# Patient Record
Sex: Male | Born: 1955 | Race: White | Hispanic: No | Marital: Married | Smoking: Current every day smoker
Health system: Southern US, Community
[De-identification: ages and names within clinical notes are randomized; demographics above are authoritative.]

## PROBLEM LIST (undated history)

## (undated) DIAGNOSIS — E785 Hyperlipidemia, unspecified: Secondary | ICD-10-CM

## (undated) DIAGNOSIS — R739 Hyperglycemia, unspecified: Secondary | ICD-10-CM

## (undated) DIAGNOSIS — M1990 Unspecified osteoarthritis, unspecified site: Secondary | ICD-10-CM

## (undated) DIAGNOSIS — C4490 Unspecified malignant neoplasm of skin, unspecified: Secondary | ICD-10-CM

## (undated) DIAGNOSIS — I10 Essential (primary) hypertension: Secondary | ICD-10-CM

## (undated) DIAGNOSIS — I2510 Atherosclerotic heart disease of native coronary artery without angina pectoris: Secondary | ICD-10-CM

## (undated) HISTORY — DX: Unspecified osteoarthritis, unspecified site: M19.90

## (undated) HISTORY — DX: Hyperglycemia, unspecified: R73.9

## (undated) HISTORY — PX: SKIN SURGERY: SHX2413

## (undated) HISTORY — PX: CORONARY ANGIOPLASTY WITH STENT PLACEMENT: SHX49

## (undated) HISTORY — DX: Hyperlipidemia, unspecified: E78.5

## (undated) HISTORY — DX: Unspecified malignant neoplasm of skin, unspecified: C44.90

## (undated) HISTORY — DX: Essential (primary) hypertension: I10

---

## 2004-02-02 ENCOUNTER — Other Ambulatory Visit: Payer: Self-pay

## 2004-02-02 ENCOUNTER — Emergency Department: Payer: Self-pay | Admitting: Emergency Medicine

## 2004-04-08 ENCOUNTER — Ambulatory Visit: Payer: Self-pay | Admitting: Cardiovascular Disease

## 2004-09-08 ENCOUNTER — Emergency Department: Payer: Self-pay | Admitting: Emergency Medicine

## 2004-09-14 ENCOUNTER — Emergency Department: Payer: Self-pay | Admitting: General Practice

## 2004-11-16 ENCOUNTER — Ambulatory Visit: Payer: Self-pay | Admitting: Internal Medicine

## 2005-02-17 ENCOUNTER — Emergency Department: Payer: Self-pay | Admitting: Emergency Medicine

## 2005-03-12 ENCOUNTER — Ambulatory Visit: Payer: Self-pay | Admitting: Urology

## 2006-01-17 ENCOUNTER — Ambulatory Visit: Payer: Self-pay | Admitting: Family Medicine

## 2006-07-15 ENCOUNTER — Emergency Department: Payer: Self-pay | Admitting: Emergency Medicine

## 2006-09-11 ENCOUNTER — Ambulatory Visit: Payer: Self-pay | Admitting: Cardiology

## 2008-03-02 ENCOUNTER — Emergency Department: Payer: Self-pay | Admitting: Emergency Medicine

## 2009-12-31 ENCOUNTER — Ambulatory Visit: Payer: Self-pay | Admitting: Gastroenterology

## 2012-09-10 ENCOUNTER — Ambulatory Visit: Payer: Self-pay | Admitting: Family Medicine

## 2012-10-05 ENCOUNTER — Ambulatory Visit: Payer: Self-pay | Admitting: Family Medicine

## 2012-11-05 ENCOUNTER — Ambulatory Visit: Payer: Self-pay | Admitting: Otolaryngology

## 2013-04-07 ENCOUNTER — Ambulatory Visit: Payer: Self-pay | Admitting: Unknown Physician Specialty

## 2013-05-19 ENCOUNTER — Ambulatory Visit: Payer: Self-pay | Admitting: Family Medicine

## 2013-08-03 DIAGNOSIS — Z720 Tobacco use: Secondary | ICD-10-CM | POA: Insufficient documentation

## 2013-08-03 DIAGNOSIS — E7800 Pure hypercholesterolemia, unspecified: Secondary | ICD-10-CM | POA: Insufficient documentation

## 2013-08-03 DIAGNOSIS — I2510 Atherosclerotic heart disease of native coronary artery without angina pectoris: Secondary | ICD-10-CM | POA: Insufficient documentation

## 2013-08-03 DIAGNOSIS — R739 Hyperglycemia, unspecified: Secondary | ICD-10-CM | POA: Insufficient documentation

## 2013-08-03 DIAGNOSIS — I10 Essential (primary) hypertension: Secondary | ICD-10-CM | POA: Insufficient documentation

## 2013-12-13 ENCOUNTER — Ambulatory Visit: Payer: Self-pay | Admitting: Family Medicine

## 2014-08-08 IMAGING — CT CT CHEST W/ CM
1 series · 15 of 33 positions shown, 19 images · IV contrast (isovue)
Comparison: none

REASON FOR EXAM: SOB  TOBACCO user
COMMENTS:

PROCEDURE:     KCT - KCT CHEST WITH CONTRAST  - September 10, 2012  [DATE]
RESULT:     Comparison: None
TECHNIQUE: Multiple axial images of the chest were obtained with 75 mL
Isovue 370 intravenous contrast.

[Series 2: chest w/ 3.0 i31f 2 · axial · 0.82mm/px · z∈[-392,-116]mm · 15 of 109 slices shown, 19 images]
[im 9/109  mediastinal]
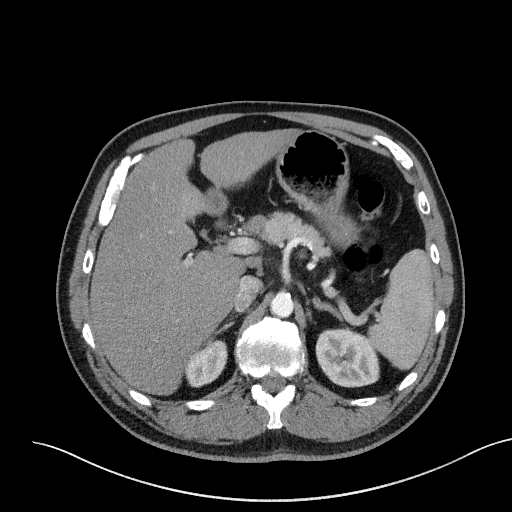
[im 9/109  lung]
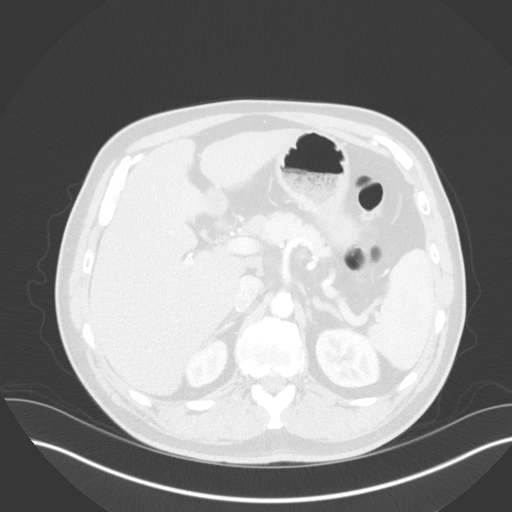
[im 17/109  lung]
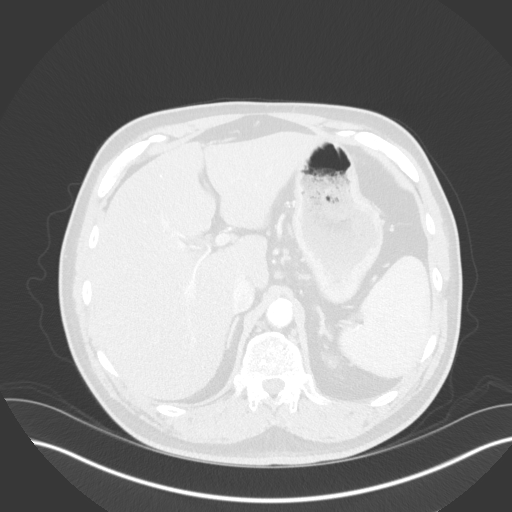
[im 22/109  lung]
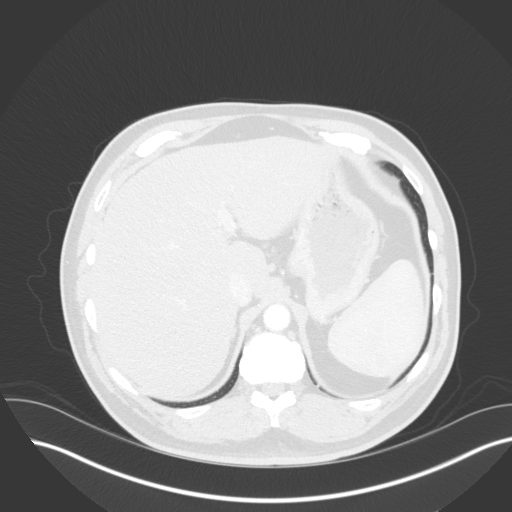
[im 29/109  lung]
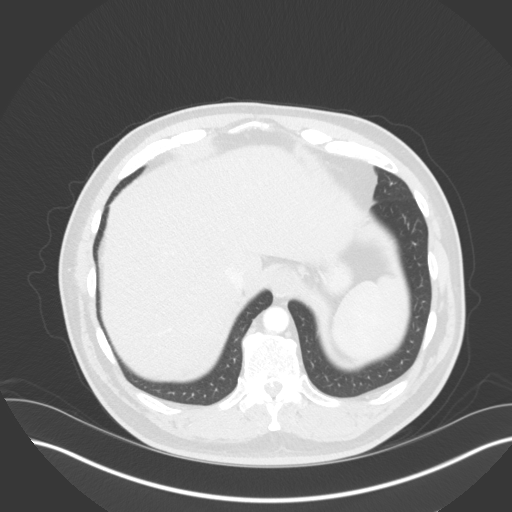
[im 37/109  mediastinal]
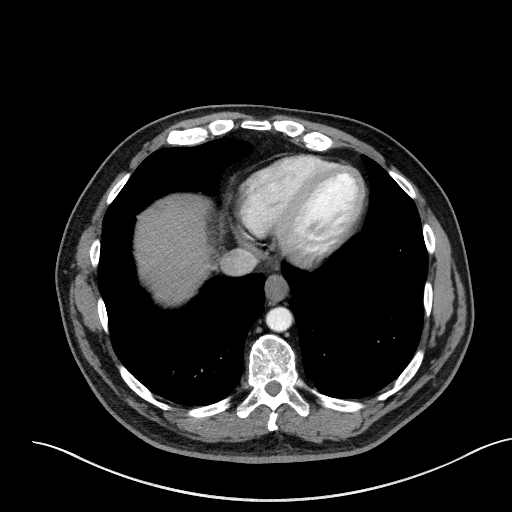
[im 37/109  lung]
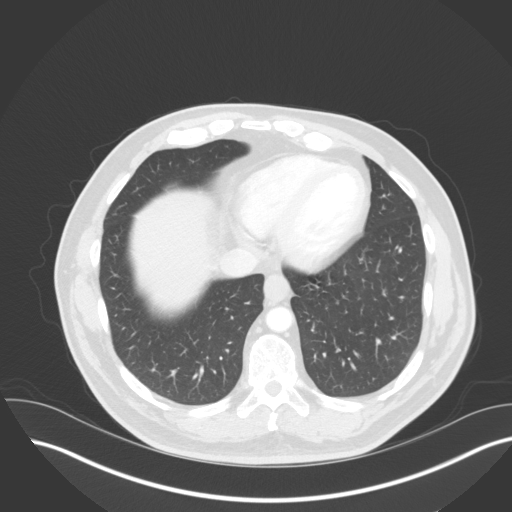
[im 44/109  lung]
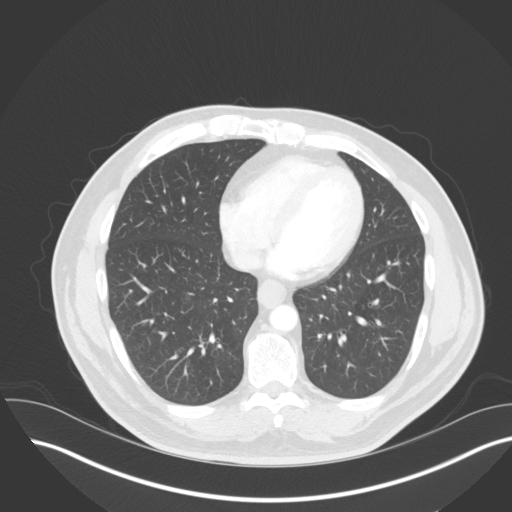
[im 49/109  lung]
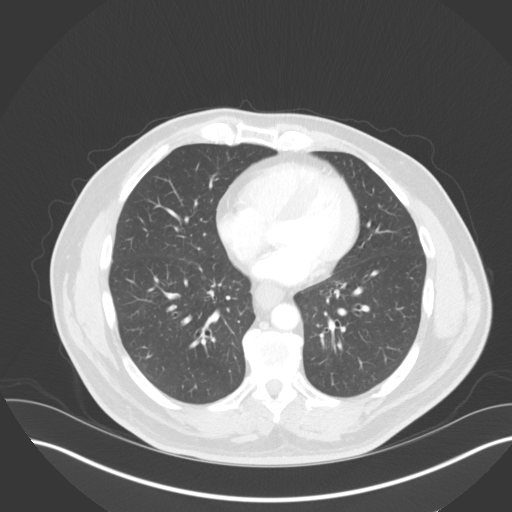
[im 53/109  lung]
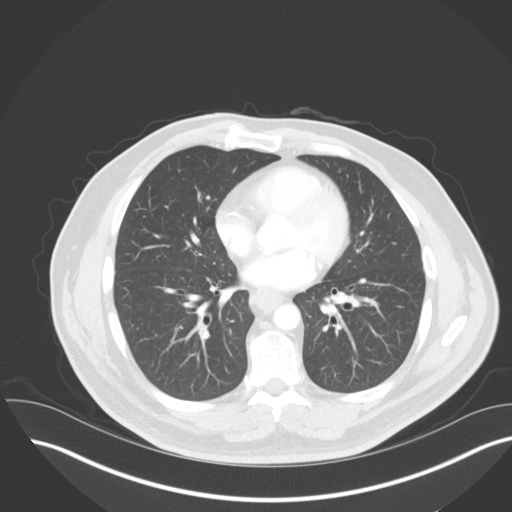
[im 58/109  mediastinal]
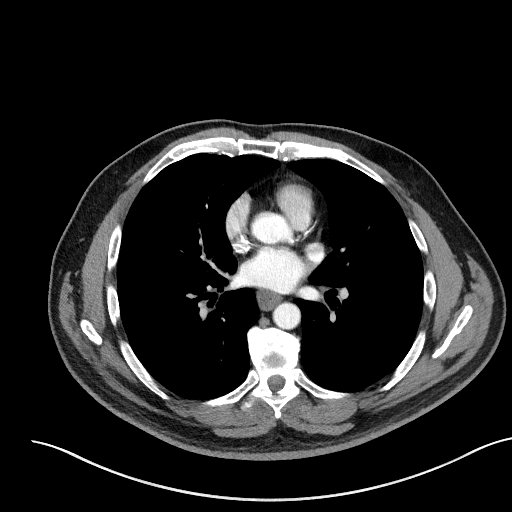
[im 58/109  lung]
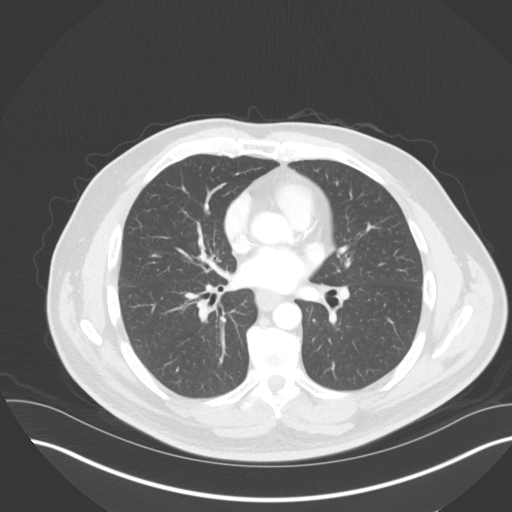
[im 65/109  lung]
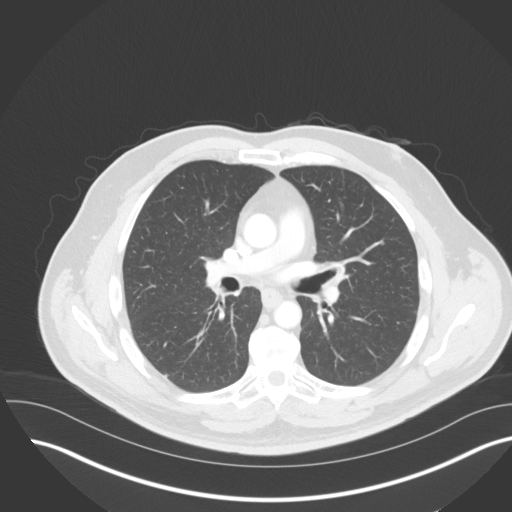
[im 73/109  lung]
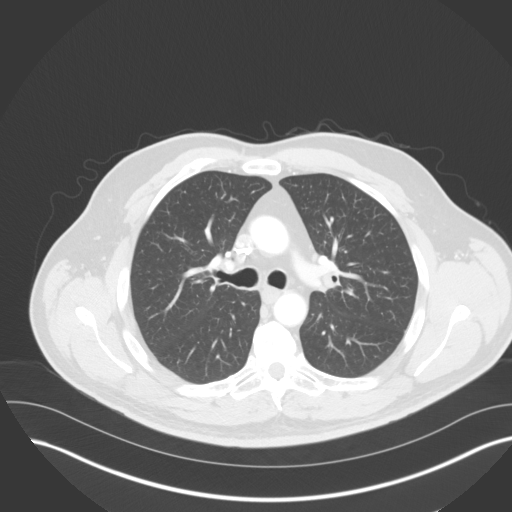
[im 81/109  lung]
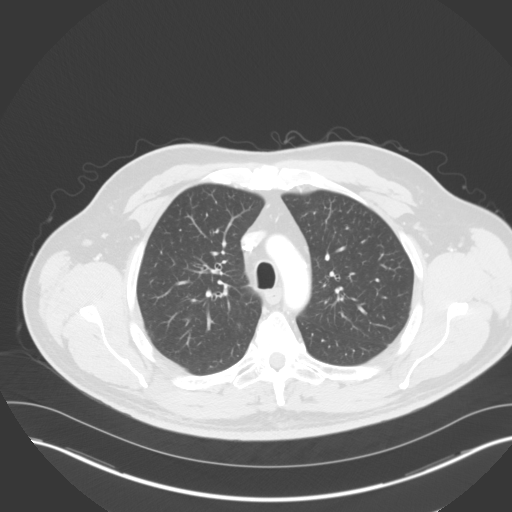
[im 87/109  mediastinal]
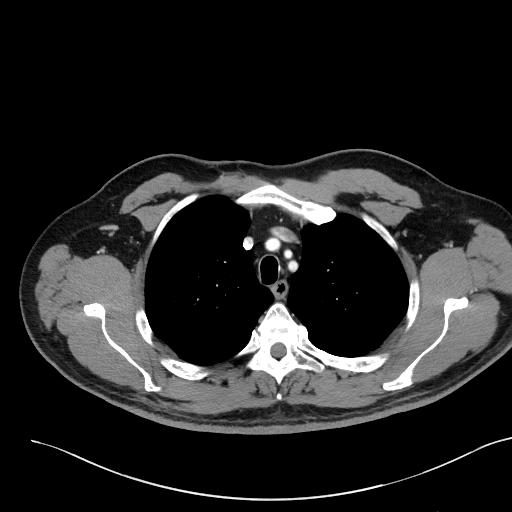
[im 87/109  lung]
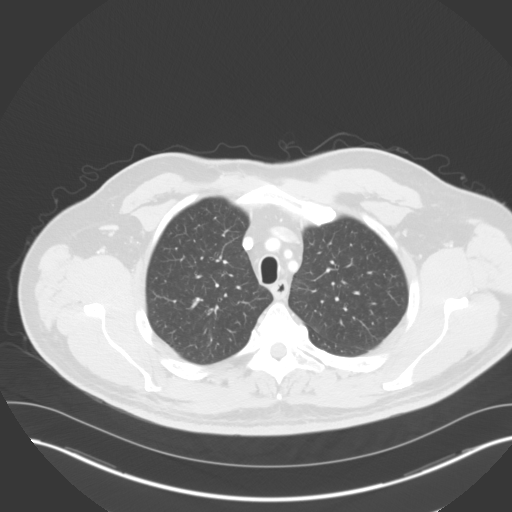
[im 93/109  lung]
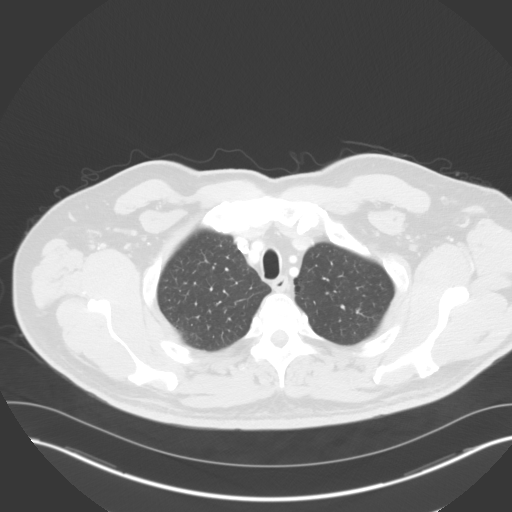
[im 101/109  lung]
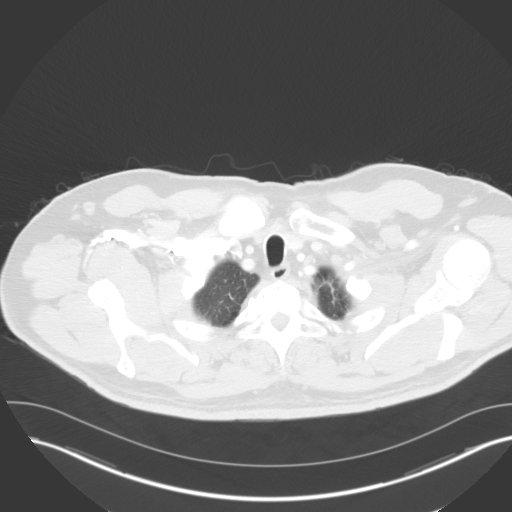

[15 of 33 positions shown; findings below may reference images not displayed]

FINDINGS: There is a 1.6 low-attenuation nodule in the right lobe the thyroid gland.
Subcentimeter nodules in the left thyroid gland. No mediastinal, hilar, or
axillary lymphadenopathy. Please note, this study was not performed for
evaluation of pulmonary embolus. Calcifications are seen in the coronary
arteries. There is wall thickening of the thoracic esophagus greatest in the
mid to distal third of the esophagus. There are multiple mildly enlarged
lymph nodes in the porta hepatis. The largest measures 1.8 x 1.8 cm.

Minimal subpleural reticular opacity at the lung apices are likely secondary
to scarring. There is minimal paraseptal emphysema the lung apices. The
central airways are patent.

No aggressive lytic or sclerotic osseous lesions are identified.
IMPRESSION: 1. There is diffuse esophageal wall thickening, greatest in the mid and
distal third of the esophagus. This is nonspecific and could be secondary to
esophagitis. However, an infiltrative etiology or malignancy cannot be
excluded. Consider further evaluation with endoscopy.
2. There are multiple mildly enlarged lymph nodes incompletely visualized in
the porta hepatis. These are nonspecific. Malignancy/lymphoma is not
excluded.
3. There is a 1.6 cm low-attenuation nodule in the right lobe of the thyroid
gland. This is nonspecific. Consider further evaluation with thyroid
ultrasound.

## 2015-02-23 ENCOUNTER — Encounter: Payer: Self-pay | Admitting: Emergency Medicine

## 2015-02-23 ENCOUNTER — Emergency Department
Admission: EM | Admit: 2015-02-23 | Discharge: 2015-02-23 | Disposition: A | Discharge status: Home / Self Care | Payer: Medicaid Other | Attending: Emergency Medicine | Admitting: Emergency Medicine

## 2015-02-23 DIAGNOSIS — K4090 Unilateral inguinal hernia, without obstruction or gangrene, not specified as recurrent: Secondary | ICD-10-CM | POA: Insufficient documentation

## 2015-02-23 DIAGNOSIS — F17210 Nicotine dependence, cigarettes, uncomplicated: Secondary | ICD-10-CM | POA: Diagnosis not present

## 2015-02-23 DIAGNOSIS — R1909 Other intra-abdominal and pelvic swelling, mass and lump: Secondary | ICD-10-CM | POA: Diagnosis present

## 2015-02-23 HISTORY — DX: Atherosclerotic heart disease of native coronary artery without angina pectoris: I25.10

## 2015-02-23 NOTE — Discharge Instructions (Signed)
Hernia, Adult A hernia is the bulging of an organ or tissue through a weak spot in the muscles of the abdomen (abdominal wall). Hernias develop most often near the navel or groin. There are many kinds of hernias. Common kinds include:  Femoral hernia. This kind of hernia develops under the groin in the upper thigh area.  Inguinal hernia. This kind of hernia develops in the groin or scrotum.  Umbilical hernia. This kind of hernia develops near the navel.  Hiatal hernia. This kind of hernia causes part of the stomach to be pushed up into the chest.  Incisional hernia. This kind of hernia bulges through a scar from an abdominal surgery. CAUSES This condition may be caused by:  Heavy lifting.  Coughing over a long period of time.  Straining to have a bowel movement.  An incision made during an abdominal surgery.  A birth defect (congenital defect).  Excess weight or obesity.  Smoking.  Poor nutrition.  Cystic fibrosis.  Excess fluid in the abdomen.  Undescended testicles. SYMPTOMS Symptoms of a hernia include:  A lump on the abdomen. This is the first sign of a hernia. The lump may become more obvious with standing, straining, or coughing. It may get bigger over time if it is not treated or if the condition causing it is not treated.  Pain. A hernia is usually painless, but it may become painful over time if treatment is delayed. The pain is usually dull and may get worse with standing or lifting heavy objects. Sometimes a hernia gets tightly squeezed in the weak spot (strangulated) or stuck there (incarcerated) and causes additional symptoms. These symptoms may include:  Vomiting.  Nausea.  Constipation.  Irritability. DIAGNOSIS A hernia may be diagnosed with:  A physical exam. During the exam your health care provider may ask you to cough or to make a specific movement, because a hernia is usually more visible when you move.  Imaging tests. These can  include:  X-rays.  Ultrasound.  CT scan. TREATMENT A hernia that is small and painless may not need to be treated. A hernia that is large or painful may be treated with surgery. Inguinal hernias may be treated with surgery to prevent incarceration or strangulation. Strangulated hernias are always treated with surgery, because lack of blood to the trapped organ or tissue can cause it to die. Surgery to treat a hernia involves pushing the bulge back into place and repairing the weak part of the abdomen. HOME CARE INSTRUCTIONS  Avoid straining.  Do not lift anything heavier than 10 lb (4.5 kg).  Lift with your leg muscles, not your back muscles. This helps avoid strain.  When coughing, try to cough gently.  Prevent constipation. Constipation leads to straining with bowel movements, which can make a hernia worse or cause a hernia repair to break down. You can prevent constipation by:  Eating a high-fiber diet that includes plenty of fruits and vegetables.  Drinking enough fluids to keep your urine clear or pale yellow. Aim to drink 6-8 glasses of water per day.  Using a stool softener as directed by your health care provider.  Lose weight, if you are overweight.  Do not use any tobacco products, including cigarettes, chewing tobacco, or electronic cigarettes. If you need help quitting, ask your health care provider.  Keep all follow-up visits as directed by your health care provider. This is important. Your health care provider may need to monitor your condition. SEEK MEDICAL CARE IF:  You have   swelling, redness, and pain in the affected area.  Your bowel habits change. SEEK IMMEDIATE MEDICAL CARE IF:  You have a fever.  You have abdominal pain that is getting worse.  You feel nauseous or you vomit.  You cannot push the hernia back in place by gently pressing on it while you are lying down.  The hernia:  Changes in shape or size.  Is stuck outside the  abdomen.  Becomes discolored.  Feels hard or tender.   This information is not intended to replace advice given to you by your health care provider. Make sure you discuss any questions you have with your health care provider.   Document Released: 03/17/2005 Document Revised: 04/07/2014 Document Reviewed: 01/25/2014 Elsevier Interactive Patient Education 2016 [employer]  

## 2015-02-23 NOTE — ED Notes (Signed)
AAOx3.  Skin warm and dry.  NAD 

## 2015-02-23 NOTE — ED Notes (Signed)
Ambulates with easy and steady gait.  Posture upright.  Denies dysuria or difficulty moving bowels.  Denies C/O pain.

## 2015-02-23 NOTE — ED Notes (Addendum)
Patient presents to the ED with a swollen area in his right groin area that he noticed about 2-3 weeks ago.  Patient reports area is non-tender on palpation.  Patient reports straining and lifting heavy objects often.  Patient denies history of a hernia.

## 2015-02-23 NOTE — ED Provider Notes (Signed)
[facility] Emergency Department Provider Note  ____________________________________________  Time seen: On arrival  I have reviewed the triage vital signs and the nursing notes.   HISTORY  Chief Complaint Hernia    HPI Jonathon Stevenson is a 59 y.o. male who presents with right groin mass. He reports he noticed it approximately 2-3 weeks ago and has noticed increasing swelling in the right groin. He denies pain. No nausea vomiting. Normal bowel movements. No history of bowel surgery. Does report building a go-cart track in his backyard reports she has been doing shoveling and heavy lifting.     Past Medical History  Diagnosis Date  . Coronary artery disease     There are no active problems to display for this patient.   Past Surgical History  Procedure Laterality Date  . Coronary angioplasty with stent placement      No current outpatient prescriptions on file.  Allergies Review of patient's allergies indicates no known allergies.  No family history on file.  Social History Social History  Substance Use Topics  . Smoking status: Current Every Day Smoker -- 2.00 packs/day    Types: Cigarettes  . Smokeless tobacco: None  . Alcohol Use: Yes     Comment: sometimes    Review of Systems  Constitutional: Negative for fever. Eyes: Negative for visual changes. ENT: Negative for sore throat Cardiovascular: Negative for chest pain. Respiratory: Negative for shortness of breath. Gastrointestinal: Negative for abdominal pain, vomiting and diarrhea. Genitourinary: Negative for testicular pain Musculoskeletal: Negative for back pain. Skin: Negative for rash. Neurological: Negative for headaches Psychiatric: No anxiety    ____________________________________________   PHYSICAL EXAM:  VITAL SIGNS: ED Triage Vitals  Enc Vitals Group     BP 02/23/15 1755 177/84 mmHg     Pulse Rate 02/23/15 1755 88     Resp 02/23/15 1755 20     Temp  02/23/15 1755 98.3 F (36.8 C)     Temp Source 02/23/15 1755 Oral     SpO2 02/23/15 1755 96 %     Weight 02/23/15 1755 170 lb (77.111 kg)     Height 02/23/15 1755 6\' 1"  (1.854 m)     Head Cir --      Peak Flow --      Pain Score 02/23/15 1756 0     Pain Loc --      Pain Edu? --      Excl. in [place]? --      Constitutional: Alert and oriented. Well appearing and in no distress. Eyes: Conjunctivae are normal.  ENT   Head: Normocephalic and atraumatic.   Mouth/Throat: Mucous membranes are moist. Cardiovascular: Normal rate, regular rhythm. Normal and symmetric distal pulses are present in all extremities.  Respiratory: Normal respiratory effort without tachypnea nor retractions. Breath sounds are clear and equal bilaterally.  Gastrointestinal: Soft and non-tender in all quadrants. No distention. There is no CVA tenderness. Genitourinary: Patient with right inguinal hernia, easily reducible, no tenderness to palpation, no erythema Musculoskeletal: Nontender with normal range of motion in all extremities. No lower extremity tenderness nor edema. Neurologic:  Normal speech and language. No gross focal neurologic deficits are appreciated. Skin:  Skin is warm, dry and intact. No rash noted. Psychiatric: Mood and affect are normal. Patient exhibits appropriate insight and judgment.  ____________________________________________    LABS (pertinent positives/negatives)  Labs Reviewed - No data to display  ____________________________________________   EKG  None  ____________________________________________    RADIOLOGY I have personally reviewed  any xrays that were ordered on this patient: None  ____________________________________________   PROCEDURES  Procedure(s) performed: none  Critical Care performed: none  ____________________________________________   INITIAL IMPRESSION / ASSESSMENT AND PLAN / ED COURSE  Pertinent labs & imaging results that were  available during my care of the patient were reviewed by me and considered in my medical decision making (see chart for details).  Patient with right inguinal hernia which is easily reducible and is not causing any pain. Recommended outpatient general surgery follow-up for  definitive repair  ____________________________________________   FINAL CLINICAL IMPRESSION(S) / ED DIAGNOSES  Final diagnoses:  Unilateral inguinal hernia without obstruction or gangrene, recurrence not specified     Lavonia Drafts, MD 02/23/15 [phone]

## 2015-03-02 ENCOUNTER — Ambulatory Visit (INDEPENDENT_AMBULATORY_CARE_PROVIDER_SITE_OTHER): Payer: Medicaid Other | Admitting: Surgery

## 2015-03-02 ENCOUNTER — Encounter: Payer: Self-pay | Admitting: Surgery

## 2015-03-02 VITALS — BP 196/100 | HR 92 | Temp 98.4°F | Ht 73.0 in | Wt 185.6 lb

## 2015-03-02 DIAGNOSIS — K4090 Unilateral inguinal hernia, without obstruction or gangrene, not specified as recurrent: Secondary | ICD-10-CM | POA: Diagnosis not present

## 2015-03-02 DIAGNOSIS — Z720 Tobacco use: Secondary | ICD-10-CM

## 2015-03-02 NOTE — Progress Notes (Signed)
Subjective:     Patient ID: Jonathon Stevenson, male   DOB: 1955/08/11, 59 y.o.   MRN: [id number]  HPI  59 yr old male with complaint of bulge in Right groin.  Patient was seen an evaluated in the ED recently as well.  Patient states that it does not cause him pain and doesn't bother him much he was just concerned about it.  Patient states he noticed it about 3 months back.  He states that he noticed it more when he lifts heavy things or has to strain.  He denies every having it come out and not go back in.  He denies any fever, chills, sob, chest pain, nausea, vomiting, diarrhea or constipation.   Past Medical History  Diagnosis Date  . Coronary artery disease   . Hypertension   . Hyperlipidemia   . Hyperglycemia   . Skin cancer   . Arthritis    Past Surgical History  Procedure Laterality Date  . Coronary angioplasty with stent placement    . Skin surgery      Cancerour lesion removed   Family History  Problem Relation Age of Onset  . Heart disease Mother   . Heart disease Sister   . Hypertension Sister    Social History   Social History  . Marital Status: Married    Spouse Name: N/A  . Number of Children: N/A  . Years of Education: N/A   Social History Main Topics  . Smoking status: Current Every Day Smoker -- 2.00 packs/day    Types: Cigarettes  . Smokeless tobacco: Never Used  . Alcohol Use: 4.2 oz/week    7 Cans of beer per week  . Drug Use: No  . Sexual Activity: Not Asked   Other Topics Concern  . None   Social History Narrative    Current outpatient prescriptions:  .  amLODipine (NORVASC) 5 MG tablet, Take 5 mg by mouth daily., Disp: , Rfl:  .  aspirin 81 MG chewable tablet, Chew 81 mg by mouth daily., Disp: , Rfl:  .  atenolol (TENORMIN) 100 MG tablet, Take 100 mg by mouth daily., Disp: , Rfl:  .  atorvastatin (LIPITOR) 20 MG tablet, Take 20 mg by mouth daily., Disp: , Rfl:  .  lisinopril-hydrochlorothiazide (PRINZIDE,ZESTORETIC) 20-25 MG tablet, Take 1  tablet by mouth daily., Disp: , Rfl:  .  naproxen (NAPROSYN) 500 MG tablet, Take 500 mg by mouth every 6 (six) hours as needed., Disp: , Rfl:  .  omeprazole (PRILOSEC) 40 MG capsule, Take 40 mg by mouth daily., Disp: , Rfl:  No Known Allergies    Review of Systems  Constitutional: Negative for fever, chills, activity change, appetite change, fatigue and unexpected weight change.  HENT: Negative for congestion and sore throat.   Respiratory: Negative for cough, shortness of breath and wheezing.   Cardiovascular: Negative for chest pain, palpitations and leg swelling.  Gastrointestinal: Negative for nausea, vomiting, abdominal pain, diarrhea, constipation, blood in stool, abdominal distention and anal bleeding.  Genitourinary: Negative for dysuria and hematuria.  Musculoskeletal: Negative for back pain and joint swelling.  Skin: Negative for color change, pallor, rash and wound.  Neurological: Negative for dizziness and weakness.  Hematological: Negative for adenopathy. Does not bruise/bleed easily.  Psychiatric/Behavioral: Negative for agitation. The patient is not nervous/anxious.   All other systems reviewed and are negative.      Filed Vitals:   03/02/15 1225 03/02/15 1228  BP: 184/104 196/100  Pulse: 80 92  Temp:  98.4 F (36.9 C)      Objective:   Physical Exam  Constitutional: He is oriented to person, place, and time. He appears well-developed and well-nourished. No distress.  HENT:  Head: Normocephalic and atraumatic.  Right Ear: External ear normal.  Left Ear: External ear normal.  Nose: Nose normal.  Mouth/Throat: Oropharynx is clear and moist. No oropharyngeal exudate.  Eyes: Conjunctivae and EOM are normal. Pupils are equal, round, and reactive to light. No scleral icterus.  Neck: Normal range of motion. Neck supple. No tracheal deviation present.  Cardiovascular: Normal rate, regular rhythm, normal heart sounds and intact distal pulses.  Exam reveals no gallop  and no friction rub.   No murmur heard. Pulmonary/Chest: Effort normal and breath sounds normal. No respiratory distress. He has no wheezes. He has no rales.  Abdominal: Soft. Bowel sounds are normal. He exhibits no distension. There is no tenderness. There is no rebound and no guarding. A hernia is present. Hernia confirmed positive in the right inguinal area and confirmed positive in the left inguinal area.  Genitourinary:     3cm right inguinal hernia palpable with pressure or coughing, reduced at baseline  Left inguinal hernia 1cm non-tender, palpable with pressure or coughing but reduced at baseline  Musculoskeletal: Normal range of motion. He exhibits no edema or tenderness.  Lymphadenopathy:       Right: No inguinal adenopathy present.       Left: No inguinal adenopathy present.  Neurological: He is alert and oriented to person, place, and time. No cranial nerve deficit.  Skin: Skin is warm and dry. No rash noted. No erythema. No pallor.  Psychiatric: He has a normal mood and affect. His behavior is normal. Judgment and thought content normal.  Vitals reviewed.      Assessment:     59 yr old male with bilateral inguinal hernias     Plan:     I discussed with the patient that he has about a 10% chance of these incarcerating acutely.  I do recommend with both sides he get this repaired laparoscopically.  However discussed his large smoking habit, and that it increases his risks for lung complications, wound infections, difficulty healing and recurrence of hernia in the future.  He is interested in stopping smoking.  He has tried Chantix in the past but is willing to try quitting by using the nicotine gum.  Will have him return in 3 months and try to stop smoking in the meantime.  He is to call if feels he would like a rx to help him quit smoking.  Also given instructions that if hernias incarcerate and do not reduce after resting for an hour should come to the ED.  Patient expressed  understanding.

## 2015-03-02 NOTE — Patient Instructions (Addendum)
Please call [phone] to help get your prescription medications. Your blood pressure is extremely elevated today!!!  Make sure that you get your blood pressure medications and begin taking them again. Your blood pressure must be in a normal range in order for [place] to do surgery on you.  Try to quit smoking over the next few months, if you can decrease your smoking enough or can stop smoking, we can discuss repairing your hernia at that time. If you try the nicotine gum without success and you would like [place] to call you in some medication, please call our office and we can do that.  Call our office back in 3 months if you have decreased smoking or stopped and we can make you an appointment.

## 2015-05-20 ENCOUNTER — Encounter: Payer: Self-pay | Admitting: Emergency Medicine

## 2015-05-20 ENCOUNTER — Emergency Department
Admission: EM | Admit: 2015-05-20 | Discharge: 2015-05-20 | Discharge status: Against Medical Advice | Payer: Medicaid Other | Attending: Emergency Medicine | Admitting: Emergency Medicine

## 2015-05-20 DIAGNOSIS — Y9389 Activity, other specified: Secondary | ICD-10-CM | POA: Insufficient documentation

## 2015-05-20 DIAGNOSIS — W1839XA Other fall on same level, initial encounter: Secondary | ICD-10-CM | POA: Diagnosis not present

## 2015-05-20 DIAGNOSIS — Z79899 Other long term (current) drug therapy: Secondary | ICD-10-CM | POA: Insufficient documentation

## 2015-05-20 DIAGNOSIS — S3992XA Unspecified injury of lower back, initial encounter: Secondary | ICD-10-CM | POA: Insufficient documentation

## 2015-05-20 DIAGNOSIS — I10 Essential (primary) hypertension: Secondary | ICD-10-CM | POA: Diagnosis not present

## 2015-05-20 DIAGNOSIS — Y998 Other external cause status: Secondary | ICD-10-CM | POA: Diagnosis not present

## 2015-05-20 DIAGNOSIS — M545 Low back pain, unspecified: Secondary | ICD-10-CM

## 2015-05-20 DIAGNOSIS — F17210 Nicotine dependence, cigarettes, uncomplicated: Secondary | ICD-10-CM | POA: Diagnosis not present

## 2015-05-20 DIAGNOSIS — Y9289 Other specified places as the place of occurrence of the external cause: Secondary | ICD-10-CM | POA: Insufficient documentation

## 2015-05-20 DIAGNOSIS — Z7982 Long term (current) use of aspirin: Secondary | ICD-10-CM | POA: Diagnosis not present

## 2015-05-20 MED ORDER — NAPROXEN 500 MG PO TABS: 500.0000 mg | ORAL_TABLET | Freq: Two times a day (BID) | ORAL | Status: DC

## 2015-05-20 MED ORDER — KETOROLAC TROMETHAMINE 30 MG/ML IJ SOLN
30.0000 mg | Freq: Once | INTRAMUSCULAR | Status: AC
  Administered 2015-05-20: 30 mg via INTRAMUSCULAR
  Filled 2015-05-20: qty 1

## 2015-05-20 MED ORDER — DIAZEPAM 5 MG PO TABS: 5.0000 mg | ORAL_TABLET | Freq: Three times a day (TID) | ORAL | Status: DC | PRN

## 2015-05-20 MED ORDER — DIAZEPAM 5 MG PO TABS
5.0000 mg | ORAL_TABLET | Freq: Once | ORAL | Status: AC
  Administered 2015-05-20: 5 mg via ORAL
  Filled 2015-05-20: qty 1

## 2015-05-20 NOTE — ED Provider Notes (Signed)
[facility] Emergency Department Provider Note  ____________________________________________  Time seen: On arrival  I have reviewed the triage vital signs and the nursing notes.   HISTORY  Chief Complaint Back Pain    HPI Jonathon Stevenson is a 60 y.o. male who presents with complaints of back pain. Patient reports he fell 2 days ago and since then he has had pain in his bilateral back. He does not have any pain overlying his spine rather he has in the lateral paraspinal areas in the lumbar region. He feels very stiff. He denies incontinence or neuro deficits. No sensory deficits. He denies abdominal pain    Past Medical History  Diagnosis Date  . Coronary artery disease   . Hypertension   . Hyperlipidemia   . Hyperglycemia   . Skin cancer   . Arthritis     Patient Active Problem List   Diagnosis Date Noted  . Right inguinal hernia 03/02/2015  . Arteriosclerosis of coronary artery 08/03/2013  . BP (high blood pressure) 08/03/2013  . Hypercholesterolemia 08/03/2013  . Blood glucose elevated 08/03/2013  . Current tobacco use 08/03/2013    Past Surgical History  Procedure Laterality Date  . Coronary angioplasty with stent placement    . Skin surgery      Cancerour lesion removed    Current Outpatient Rx  Name  Route  Sig  Dispense  Refill  . amLODipine (NORVASC) 5 MG tablet   Oral   Take 5 mg by mouth daily.         Marland Kitchen aspirin 81 MG chewable tablet   Oral   Chew 81 mg by mouth daily.         Marland Kitchen atenolol (TENORMIN) 100 MG tablet   Oral   Take 100 mg by mouth daily.         Marland Kitchen atorvastatin (LIPITOR) 20 MG tablet   Oral   Take 20 mg by mouth daily.         . diazepam (VALIUM) 5 MG tablet   Oral   Take 1 tablet (5 mg total) by mouth every 8 (eight) hours as needed for muscle spasms.   20 tablet   0   . lisinopril-hydrochlorothiazide (PRINZIDE,ZESTORETIC) 20-25 MG tablet   Oral   Take 1 tablet by mouth daily.          . naproxen (NAPROSYN) 500 MG tablet   Oral   Take 1 tablet (500 mg total) by mouth 2 (two) times daily with a meal.   20 tablet   2   . omeprazole (PRILOSEC) 40 MG capsule   Oral   Take 40 mg by mouth daily.           Allergies Review of patient's allergies indicates no known allergies.  Family History  Problem Relation Age of Onset  . Heart disease Mother   . Heart disease Sister   . Hypertension Sister     Social History Social History  Substance Use Topics  . Smoking status: Current Every Day Smoker -- 2.00 packs/day    Types: Cigarettes  . Smokeless tobacco: Never Used  . Alcohol Use: 4.2 oz/week    7 Cans of beer per week    Review of Systems  Constitutional: Negative for fever. Eyes: Negative for visual changes. ENT: Negative for sore throat   Genitourinary: Negative for dysuria. Musculoskeletal: Negative for back pain. Skin: Negative for rash. Neurological: Negative for headaches or focal weakness   ____________________________________________   PHYSICAL EXAM:  VITAL SIGNS: ED Triage Vitals  Enc Vitals Group     BP 05/20/15 1650 198/96 mmHg     Pulse Rate 05/20/15 1644 79     Resp 05/20/15 1644 16     Temp 05/20/15 1644 97.9 F (36.6 C)     Temp Source 05/20/15 1644 Oral     SpO2 05/20/15 1644 99 %     Weight 05/20/15 1644 170 lb (77.111 kg)     Height 05/20/15 1644 6\' 1"  (1.854 m)     Head Cir --      Peak Flow --      Pain Score 05/20/15 1644 8     Pain Loc --      Pain Edu? --      Excl. in [place]? --      Constitutional: Alert and oriented. Well appearing and in no distress. Eyes: Conjunctivae are normal.  ENT   Head: Normocephalic and atraumatic.   Mouth/Throat: Mucous membranes are moist. Cardiovascular: Normal rate, regular rhythm.  Respiratory: Normal respiratory effort without tachypnea nor retractions.  Gastrointestinal: Soft and non-tender in all quadrants. No pulsatile mass Musculoskeletal: Nontender with normal  range of motion in all extremities. No vertebral tenderness to palpation. Muscle spasms in the lumbar paraspinal area bilaterally. Normal lower extremity strength and sensation Neurologic:  Normal speech and language. No gross focal neurologic deficits are appreciated. Skin:  Skin is warm, dry and intact. No rash noted. Psychiatric: Mood and affect are normal. Patient exhibits appropriate insight and judgment.  ____________________________________________    LABS (pertinent positives/negatives)  Labs Reviewed - No data to display  ____________________________________________     ____________________________________________    RADIOLOGY I have personally reviewed any xrays that were ordered on this patient: None  ____________________________________________   PROCEDURES  Procedure(s) performed: none   ____________________________________________   INITIAL IMPRESSION / ASSESSMENT AND PLAN / ED COURSE  Pertinent labs & imaging results that were available during my care of the patient were reviewed by me and considered in my medical decision making (see chart for details).  Patient given IM Toradol and by mouth Valium which he reports significantly improved his pain.  I'll discharge with by mouth Valium and naproxen. Recommend PCP follow-up. Return precautions discussed  ____________________________________________   FINAL CLINICAL IMPRESSION(S) / ED DIAGNOSES  Final diagnoses:  Bilateral low back pain without sciatica     Lavonia Drafts, MD 05/20/15 2238

## 2015-05-20 NOTE — Discharge Instructions (Signed)

## 2015-05-20 NOTE — ED Notes (Signed)
Discussed discharge instructions, prescriptions, and follow-up care with patient. No questions or concerns at this time. Pt stable at discharge.  

## 2015-05-20 NOTE — ED Notes (Addendum)
Pt fell on Friday getting wood for wood stove.  Pt hit head on Friday and had positive LOC. Only wants to be seen for his back pain; hx chronic back pain r/t bulging discs. Denies any dizziness.

## 2015-05-20 NOTE — ED Notes (Signed)
Called x2 to go to treatment room, no answer in the lobby.

## 2015-05-20 NOTE — ED Notes (Signed)
Called for patient x 2. Looked outside, and did not find patient.

## 2015-05-28 ENCOUNTER — Telehealth: Payer: Self-pay

## 2015-05-28 NOTE — Telephone Encounter (Signed)
Patient was seen by Dr. Azalee Course in early December. Please call patient and set-up 3 month follow up. Appointment will be for: (3 Month Follow-up: Biliateral Inguinal Hernia)

## 2015-05-29 NOTE — Telephone Encounter (Signed)
Called patient to schedule 3 month follow up appointment with Dr Azalee Course. No answer. Left message for Jonathon Stevenson to return my call to schedule same. I will attempt to call him back later today and again tomorrow if I don't hear back from him.

## 2015-06-01 NOTE — Telephone Encounter (Signed)
Called patient to schedule his 3 month follow up appointment with Dr Azalee Course. Patient states he fell on a wood pile and is down in his back. He doesn't know how long he will be down, but states he will call us to schedule his follow up appointment once he is feeling better.

## 2015-12-12 ENCOUNTER — Emergency Department: Payer: Medicaid Other

## 2015-12-12 ENCOUNTER — Inpatient Hospital Stay
Admit: 2015-12-12 | Discharge: 2015-12-12 | Disposition: A | Discharge status: Home / Self Care | Payer: Medicaid Other | Attending: Internal Medicine | Admitting: Internal Medicine

## 2015-12-12 ENCOUNTER — Inpatient Hospital Stay
Admission: EM | Admit: 2015-12-12 | Discharge: 2015-12-14 | DRG: 282 | Disposition: A | Discharge status: Home / Self Care | Payer: Medicaid Other | Attending: Internal Medicine | Admitting: Internal Medicine

## 2015-12-12 DIAGNOSIS — I214 Non-ST elevation (NSTEMI) myocardial infarction: Secondary | ICD-10-CM | POA: Diagnosis present

## 2015-12-12 DIAGNOSIS — Z91120 Patient's intentional underdosing of medication regimen due to financial hardship: Secondary | ICD-10-CM

## 2015-12-12 DIAGNOSIS — I2582 Chronic total occlusion of coronary artery: Secondary | ICD-10-CM | POA: Diagnosis present

## 2015-12-12 DIAGNOSIS — I2510 Atherosclerotic heart disease of native coronary artery without angina pectoris: Secondary | ICD-10-CM | POA: Diagnosis present

## 2015-12-12 DIAGNOSIS — I10 Essential (primary) hypertension: Secondary | ICD-10-CM | POA: Diagnosis present

## 2015-12-12 DIAGNOSIS — T465X6A Underdosing of other antihypertensive drugs, initial encounter: Secondary | ICD-10-CM | POA: Diagnosis present

## 2015-12-12 DIAGNOSIS — Z955 Presence of coronary angioplasty implant and graft: Secondary | ICD-10-CM | POA: Diagnosis not present

## 2015-12-12 DIAGNOSIS — E785 Hyperlipidemia, unspecified: Secondary | ICD-10-CM | POA: Diagnosis present

## 2015-12-12 DIAGNOSIS — F17210 Nicotine dependence, cigarettes, uncomplicated: Secondary | ICD-10-CM | POA: Diagnosis present

## 2015-12-12 LAB — BASIC METABOLIC PANEL
Anion gap: 7 (ref 5–15)
BUN: 17 mg/dL (ref 6–20)
CO2: 25 mmol/L (ref 22–32)
CREATININE: 1.04 mg/dL (ref 0.61–1.24)
Calcium: 9.3 mg/dL (ref 8.9–10.3)
Chloride: 109 mmol/L (ref 101–111)
Glucose, Bld: 147 mg/dL — ABNORMAL HIGH (ref 65–99)
POTASSIUM: 3.4 mmol/L — AB (ref 3.5–5.1)
Sodium: 141 mmol/L (ref 135–145)

## 2015-12-12 LAB — CBC
HEMATOCRIT: 43.4 % (ref 40.0–52.0)
Hemoglobin: 15.2 g/dL (ref 13.0–18.0)
MCH: 32.1 pg (ref 26.0–34.0)
MCHC: 35 g/dL (ref 32.0–36.0)
MCV: 91.8 fL (ref 80.0–100.0)
PLATELETS: 123 10*3/uL — AB (ref 150–440)
RBC: 4.72 MIL/uL (ref 4.40–5.90)
RDW: 13.3 % (ref 11.5–14.5)
WBC: 8.7 10*3/uL (ref 3.8–10.6)

## 2015-12-12 LAB — PROTIME-INR
INR: 1.02
PROTHROMBIN TIME: 13.4 s (ref 11.4–15.2)

## 2015-12-12 LAB — ECHOCARDIOGRAM COMPLETE
Height: 72 in
WEIGHTICAEL: 2896 [oz_av]

## 2015-12-12 LAB — TROPONIN I
TROPONIN I: 1.12 ng/mL — AB (ref <0.03)
Troponin I: 0.94 ng/mL (ref <0.03)

## 2015-12-12 LAB — APTT: APTT: 30 s (ref 24–36)

## 2015-12-12 MED ORDER — NITROGLYCERIN 2 % TD OINT
0.5000 [in_us] | TOPICAL_OINTMENT | Freq: Four times a day (QID) | TRANSDERMAL | Status: DC
  Administered 2015-12-13 – 2015-12-14 (×4): 0.5 [in_us] via TOPICAL
  Filled 2015-12-12 (×4): qty 1

## 2015-12-12 MED ORDER — NITROGLYCERIN 2 % TD OINT
1.0000 [in_us] | TOPICAL_OINTMENT | Freq: Once | TRANSDERMAL | Status: AC
  Administered 2015-12-12: 1 [in_us] via TOPICAL

## 2015-12-12 MED ORDER — ATORVASTATIN CALCIUM 20 MG PO TABS: 40.0000 mg | ORAL_TABLET | Freq: Every day | ORAL | Status: DC

## 2015-12-12 MED ORDER — ONDANSETRON HCL 4 MG/2ML IJ SOLN: 4.0000 mg | Freq: Four times a day (QID) | INTRAMUSCULAR | Status: DC | PRN

## 2015-12-12 MED ORDER — HYDRALAZINE HCL 20 MG/ML IJ SOLN: 10.0000 mg | Freq: Four times a day (QID) | INTRAMUSCULAR | Status: DC | PRN

## 2015-12-12 MED ORDER — AMLODIPINE BESYLATE 5 MG PO TABS
5.0000 mg | ORAL_TABLET | Freq: Every day | ORAL | Status: DC
  Administered 2015-12-12 – 2015-12-14 (×3): 5 mg via ORAL
  Filled 2015-12-12 (×3): qty 1

## 2015-12-12 MED ORDER — HEPARIN SODIUM (PORCINE) 5000 UNIT/ML IJ SOLN: 4000.0000 [IU] | Freq: Once | INTRAMUSCULAR | Status: DC

## 2015-12-12 MED ORDER — HEPARIN BOLUS VIA INFUSION
4000.0000 [IU] | Freq: Once | INTRAVENOUS | Status: AC
Start: 2015-12-12 — End: 2015-12-12
  Administered 2015-12-12: 4000 [IU] via INTRAVENOUS
  Filled 2015-12-12: qty 4000

## 2015-12-12 MED ORDER — METOPROLOL TARTRATE 25 MG PO TABS
25.0000 mg | ORAL_TABLET | Freq: Two times a day (BID) | ORAL | Status: DC
Start: 2015-12-12 — End: 2015-12-14
  Administered 2015-12-12 – 2015-12-14 (×4): 25 mg via ORAL
  Filled 2015-12-12 (×4): qty 1

## 2015-12-12 MED ORDER — PNEUMOCOCCAL VAC POLYVALENT 25 MCG/0.5ML IJ INJ
0.5000 mL | INJECTION | INTRAMUSCULAR | Status: AC
  Administered 2015-12-14: 0.5 mL via INTRAMUSCULAR
  Filled 2015-12-12: qty 0.5

## 2015-12-12 MED ORDER — ACETAMINOPHEN 325 MG PO TABS: 650.0000 mg | ORAL_TABLET | Freq: Four times a day (QID) | ORAL | Status: DC | PRN

## 2015-12-12 MED ORDER — NITROGLYCERIN 2 % TD OINT
TOPICAL_OINTMENT | TRANSDERMAL | Status: AC
  Administered 2015-12-12: 1 [in_us] via TOPICAL
  Filled 2015-12-12: qty 1

## 2015-12-12 MED ORDER — TRAMADOL HCL 50 MG PO TABS
50.0000 mg | ORAL_TABLET | Freq: Four times a day (QID) | ORAL | Status: DC | PRN
  Administered 2015-12-13: 50 mg via ORAL
  Filled 2015-12-12: qty 1

## 2015-12-12 MED ORDER — ONDANSETRON HCL 4 MG PO TABS: 4.0000 mg | ORAL_TABLET | Freq: Four times a day (QID) | ORAL | Status: DC | PRN

## 2015-12-12 MED ORDER — ACETAMINOPHEN 650 MG RE SUPP
650.0000 mg | Freq: Four times a day (QID) | RECTAL | Status: DC | PRN
Start: 2015-12-12 — End: 2015-12-14

## 2015-12-12 MED ORDER — HEPARIN (PORCINE) IN NACL 100-0.45 UNIT/ML-% IJ SOLN
1250.0000 [IU]/h | INTRAMUSCULAR | Status: DC
  Administered 2015-12-12: 1000 [IU]/h via INTRAVENOUS
  Filled 2015-12-12: qty 250

## 2015-12-12 MED ORDER — ASPIRIN 81 MG PO CHEW
324.0000 mg | CHEWABLE_TABLET | Freq: Once | ORAL | Status: AC
  Administered 2015-12-12: 324 mg via ORAL
  Filled 2015-12-12: qty 4

## 2015-12-12 MED ORDER — INFLUENZA VAC SPLIT QUAD 0.5 ML IM SUSY
0.5000 mL | PREFILLED_SYRINGE | INTRAMUSCULAR | Status: AC
  Administered 2015-12-14: 0.5 mL via INTRAMUSCULAR
  Filled 2015-12-12: qty 0.5

## 2015-12-12 MED ORDER — SODIUM CHLORIDE 0.9 % IV SOLN
Freq: Once | INTRAVENOUS | Status: AC
  Administered 2015-12-12: 21:00:00 via INTRAVENOUS

## 2015-12-12 MED ORDER — ASPIRIN EC 81 MG PO TBEC
81.0000 mg | DELAYED_RELEASE_TABLET | Freq: Every day | ORAL | Status: DC
  Administered 2015-12-13 – 2015-12-14 (×2): 81 mg via ORAL
  Filled 2015-12-12 (×2): qty 1

## 2015-12-12 NOTE — Progress Notes (Signed)
Pt. admitted to unit, [id number] from ED. Oriented to room, call bell, Ascom phones and staff. Bed in low position. Fall safety plan reviewed, blue non-skid socks in place. Focused assessment to Epic; skin assessed with Earlyne Iba., RN. Telemetry box verified with CCMD and Alinda Deem, NT: [id number]. Heparin gtt infusing at 37ml/hr; admission profile completed. Will continue to monitor.

## 2015-12-12 NOTE — ED Provider Notes (Signed)
[facility] Emergency Department Provider Note  ____________________________________________   First MD Initiated Contact with Patient 12/12/15 1625     (approximate)  I have reviewed the triage vital signs and the nursing notes.   HISTORY  Chief Complaint Chest Pain and Shortness of Breath    HPI Jonathon Stevenson is a 60 y.o. male with a history of ACS/CAD who thinks he last had a stent 10-15 years ago who presents with chest pain.  He states that he has been having episodic chest pain for about 2 weeks which is new and different for him.  It is worse with exertion and better with rest.  He takes no daily aspirin and has been noncompliant with his blood pressure medicine for more than a year.  He came in today because the pain was much worse today and was happening at rest.  Denies difficulty breathing except with exertion.  He denies fever/chills, abdominal pain, nausea, vomiting, dysuria.  Dr. Ubaldo Glassing was his cardiologist.   Past Medical History:  Diagnosis Date  . Arthritis   . Coronary artery disease   . Hyperglycemia   . Hyperlipidemia   . Hypertension   . Skin cancer     Patient Active Problem List   Diagnosis Date Noted  . Right inguinal hernia 03/02/2015  . Arteriosclerosis of coronary artery 08/03/2013  . BP (high blood pressure) 08/03/2013  . Hypercholesterolemia 08/03/2013  . Blood glucose elevated 08/03/2013  . Current tobacco use 08/03/2013    Past Surgical History:  Procedure Laterality Date  . CORONARY ANGIOPLASTY WITH STENT PLACEMENT    . SKIN SURGERY     Cancerour lesion removed    Prior to Admission medications   Medication Sig Start Date End Date Taking? Authorizing Provider  amLODipine (NORVASC) 5 MG tablet Take 5 mg by mouth daily.    Historical Provider, MD  aspirin 81 MG chewable tablet Chew 81 mg by mouth daily.    Historical Provider, MD  atenolol (TENORMIN) 100 MG tablet Take 100 mg by mouth daily.    Historical  Provider, MD  lisinopril-hydrochlorothiazide (PRINZIDE,ZESTORETIC) 20-25 MG tablet Take 1 tablet by mouth daily.    Historical Provider, MD  omeprazole (PRILOSEC) 40 MG capsule Take 40 mg by mouth daily.    Historical Provider, MD  rosuvastatin (CRESTOR) 20 MG tablet Take 20 mg by mouth daily.    Historical Provider, MD    Allergies Review of patient's allergies indicates no known allergies.  Family History  Problem Relation Age of Onset  . Heart disease Mother   . Heart disease Sister   . Hypertension Sister     Social History Social History  Substance Use Topics  . Smoking status: Current Every Day Smoker    Packs/day: 2.00    Types: Cigarettes  . Smokeless tobacco: Never Used  . Alcohol use 4.2 oz/week    7 Cans of beer per week    Review of Systems Constitutional: No fever/chills Eyes: No visual changes. ENT: No sore throat. Cardiovascular: +chest pain. Respiratory: +shortness of breath. Gastrointestinal: No abdominal pain.  No nausea, no vomiting.  No diarrhea.  No constipation. Genitourinary: Negative for dysuria. Musculoskeletal: Negative for back pain. Skin: Negative for rash. Neurological: Negative for headaches, focal weakness or numbness.  10-point ROS otherwise negative.  ____________________________________________   PHYSICAL EXAM:  VITAL SIGNS: ED Triage Vitals  Enc Vitals Group     BP 12/12/15 1517 (!) 187/80     Pulse Rate 12/12/15 1517 92  Resp 12/12/15 1517 18     Temp 12/12/15 1517 97.4 F (36.3 C)     Temp Source 12/12/15 1517 Oral     SpO2 12/12/15 1517 98 %     Weight 12/12/15 1519 181 lb (82.1 kg)     Height 12/12/15 1519 6' (1.829 m)     Head Circumference --      Peak Flow --      Pain Score --      Pain Loc --      Pain Edu? --      Excl. in [place]? --     Constitutional: Alert and oriented. Well appearing and in no acute distress. Eyes: Conjunctivae are normal. PERRL. EOMI. Head: Atraumatic. Nose: No  congestion/rhinnorhea. Mouth/Throat: Mucous membranes are moist.  Oropharynx non-erythematous. Neck: No stridor.  No meningeal signs.   Cardiovascular: Normal rate, regular rhythm. Good peripheral circulation. Grossly normal heart sounds. Respiratory: Normal respiratory effort.  No retractions. Lungs CTAB. Gastrointestinal: Soft and nontender. No distention.  Musculoskeletal: No lower extremity tenderness nor edema. No gross deformities of extremities. Neurologic:  Normal speech and language. No gross focal neurologic deficits are appreciated.  Skin:  Skin is warm, dry and intact. No rash noted. Psychiatric: Mood and affect are normal. Speech and behavior are normal.  ____________________________________________   LABS (all labs ordered are listed, but only abnormal results are displayed)  Labs Reviewed  BASIC METABOLIC PANEL - Abnormal; Notable for the following:       Result Value   Potassium 3.4 (*)    Glucose, Bld 147 (*)    All other components within normal limits  CBC - Abnormal; Notable for the following:    Platelets 123 (*)    All other components within normal limits  TROPONIN I - Abnormal; Notable for the following:    Troponin I 0.94 (*)    All other components within normal limits  APTT  PROTIME-INR  HEPARIN LEVEL (UNFRACTIONATED)   ____________________________________________  EKG  ED ECG REPORT I, Enmanuel Zufall, the attending physician, personally viewed and interpreted this ECG.  Date: 12/12/2015 EKG Time: 15:18 Rate: 87 Rhythm: normal sinus rhythm QRS Axis: normal Intervals: normal ST/T Wave abnormalities: Non-specific ST segment / T-wave changes, but no evidence of acute ischemia. Conduction Disturbances: none Narrative Interpretation: No evidence of acute ischemia  ____________________________________________  RADIOLOGY   Dg Chest 2 View  Result Date: 12/12/2015 CLINICAL DATA:  Left-sided chest pain and shortness of breath today. EXAM: CHEST   2 VIEW COMPARISON:  CT chest 09/10/2012. FINDINGS: The lungs are clear. Heart size is normal. No pneumothorax or pleural effusion. Aortic atherosclerosis is identified. Thoracic spondylosis is noted. IMPRESSION: No acute disease. Atherosclerosis. Electronically Signed   By: Inge Rise M.D.   On: 12/12/2015 16:45    ____________________________________________   PROCEDURES  Procedure(s) performed:   .Critical Care Performed by: Hinda Kehr Authorized by: Hinda Kehr   Critical care provider statement:    Critical care time (minutes):  30   Critical care time was exclusive of:  Separately billable procedures and treating other patients   Critical care was necessary to treat or prevent imminent or life-threatening deterioration of the following conditions:  Cardiac failure   Critical care was time spent personally by me on the following activities:  Development of treatment plan with patient or surrogate, discussions with consultants, evaluation of patient's response to treatment, examination of patient, obtaining history from patient or surrogate, ordering and performing treatments and interventions, ordering and review  of laboratory studies, ordering and review of radiographic studies, pulse oximetry, re-evaluation of patient's condition and review of old charts      Critical Care performed: Yes, see critical care procedure note(s) ____________________________________________   INITIAL IMPRESSION / ASSESSMENT AND PLAN / ED COURSE  Pertinent labs & imaging results that were available during my care of the patient were reviewed by me and considered in my medical decision making (see chart for details).  The patient has uncontrolled hypertension and signs and symptoms consistent with ACS/NSTEMI.  He is noncompliant with his blood pressure medicines, continues to smoke, and is having episodic chest pain with exertion and at rest (unstable angina) but with a significantly  positive troponin (NSTEMI).  Her dictation for acute cardiac intervention given relatively reassuring EKG.  Full dose aspirin, heparin bolus, heparin infusion, nitroglycerin paste.  Admit to hospitalist.  Discussed with Dr. Posey Pronto in person.   ____________________________________________  FINAL CLINICAL IMPRESSION(S) / ED DIAGNOSES  Final diagnoses:  NSTEMI (non-ST elevated myocardial infarction) ([place])  Uncontrolled hypertension     MEDICATIONS GIVEN DURING THIS VISIT:  Medications  heparin bolus via infusion 4,000 Units (not administered)    Followed by  heparin ADULT infusion 100 units/mL (25000 units/272mL sodium chloride 0.45%) (not administered)  aspirin chewable tablet 324 mg (324 mg Oral Given 12/12/15 1655)  nitroGLYCERIN (NITROGLYN) 2 % ointment 1 inch (1 inch Topical Given 12/12/15 1705)     NEW OUTPATIENT MEDICATIONS STARTED DURING THIS VISIT:  New Prescriptions   No medications on file    Modified Medications   No medications on file    Discontinued Medications   ATORVASTATIN (LIPITOR) 20 MG TABLET    Take 20 mg by mouth daily.   DIAZEPAM (VALIUM) 5 MG TABLET    Take 1 tablet (5 mg total) by mouth every 8 (eight) hours as needed for muscle spasms.   NAPROXEN (NAPROSYN) 500 MG TABLET    Take 1 tablet (500 mg total) by mouth 2 (two) times daily with a meal.     Note:  This document was prepared using Dragon voice recognition software and may include unintentional dictation errors.    Hinda Kehr, MD 12/12/15 1721

## 2015-12-12 NOTE — ED Notes (Signed)
MD at bedside. 

## 2015-12-12 NOTE — H&P (Signed)
[facility] NAME: Jonathon Stevenson    MR#:  [id number]  DATE OF BIRTH:  Aug 14, 1955  DATE OF ADMISSION:  12/12/2015  PRIMARY CARE PHYSICIAN: No PCP Per Patient   REQUESTING/REFERRING PHYSICIAN: dr Karma Greaser  CHIEF COMPLAINT:  Chest pain on and off for 3 weeks worse today  HISTORY OF PRESENT ILLNESS:  Jonathon Stevenson  is a 60 y.o. male with a known history of Coronary artery disease status post PTCA 1994 to his RCA and stenting procedure in 2000 for 2 and a normal vessel, hypertension, heavy tobacco abuse comes to the emergency room with on and off chest pain for last 3 weeks. Patient states chest pain gets worsened with lawn mowing and exertional activity at home. Carotid worse today and would not ease off came to the emergency room was found to have troponin of 0.94 without acute EKG changes is being admitted for non-ST MI. He received aspirin and Nitropaste and is being started on IV heparin drip. Patient is being admitted for further evaluation and management. He denies any chest pain at present  PAST MEDICAL HISTORY:   Past Medical History:  Diagnosis Date  . Arthritis   . Coronary artery disease   . Hyperglycemia   . Hyperlipidemia   . Hypertension   . Skin cancer     PAST SURGICAL HISTOIRY:   Past Surgical History:  Procedure Laterality Date  . CORONARY ANGIOPLASTY WITH STENT PLACEMENT    . SKIN SURGERY     Cancerour lesion removed    SOCIAL HISTORY:   Social History  Substance Use Topics  . Smoking status: Current Every Day Smoker    Packs/day: 2.00    Types: Cigarettes  . Smokeless tobacco: Never Used  . Alcohol use 4.2 oz/week    7 Cans of beer per week    FAMILY HISTORY:   Family History  Problem Relation Age of Onset  . Heart disease Mother   . Heart disease Sister   . Hypertension Sister     DRUG ALLERGIES:  No Known Allergies  REVIEW OF SYSTEMS:  Review of Systems  Constitutional:  Negative for chills, fever and weight loss.  HENT: Negative for ear discharge, ear pain and nosebleeds.   Eyes: Negative for blurred vision, pain and discharge.  Respiratory: Negative for sputum production, shortness of breath, wheezing and stridor.   Cardiovascular: Positive for chest pain. Negative for palpitations, orthopnea and PND.  Gastrointestinal: Negative for abdominal pain, diarrhea, nausea and vomiting.  Genitourinary: Negative for frequency and urgency.  Musculoskeletal: Negative for back pain and joint pain.  Neurological: Negative for sensory change, speech change, focal weakness and weakness.  Psychiatric/Behavioral: Negative for depression and hallucinations. The patient is not nervous/anxious.      MEDICATIONS AT HOME:   Prior to Admission medications   Medication Sig Start Date End Date Taking? Authorizing Provider  amLODipine (NORVASC) 5 MG tablet Take 5 mg by mouth daily.    Historical Provider, MD  aspirin 81 MG chewable tablet Chew 81 mg by mouth daily.    Historical Provider, MD  atenolol (TENORMIN) 100 MG tablet Take 100 mg by mouth daily.    Historical Provider, MD  lisinopril-hydrochlorothiazide (PRINZIDE,ZESTORETIC) 20-25 MG tablet Take 1 tablet by mouth daily.    Historical Provider, MD  omeprazole (PRILOSEC) 40 MG capsule Take 40 mg by mouth daily.    Historical Provider, MD  rosuvastatin (CRESTOR) 20 MG tablet Take 20 mg by mouth daily.  Historical Provider, MD      VITAL SIGNS:  Blood pressure (!) 202/99, pulse 87, temperature 97.4 F (36.3 C), temperature source Oral, resp. rate (!) 22, height 6' (1.829 m), weight 82.1 kg (181 lb), SpO2 96 %.  PHYSICAL EXAMINATION:  GENERAL:  60 y.o.-year-old patient lying in the bed with no acute distress.  EYES: Pupils equal, round, reactive to light and accommodation. No scleral icterus. Extraocular muscles intact.  HEENT: Head atraumatic, normocephalic. Oropharynx and nasopharynx clear.  NECK:  Supple, no jugular  venous distention. No thyroid enlargement, no tenderness.  LUNGS: Normal breath sounds bilaterally, no wheezing, rales,rhonchi or crepitation. No use of accessory muscles of respiration.  CARDIOVASCULAR: S1, S2 normal. No murmurs, rubs, or gallops.  ABDOMEN: Soft, nontender, nondistended. Bowel sounds present. No organomegaly or mass.  EXTREMITIES: No pedal edema, cyanosis, or clubbing.  NEUROLOGIC: Cranial nerves II through XII are intact. Muscle strength 5/5 in all extremities. Sensation intact. Gait not checked.  PSYCHIATRIC: The patient is alert and oriented x 3.  SKIN: No obvious rash, lesion, or ulcer.   LABORATORY PANEL:   CBC  Recent Labs Lab 12/12/15 1524  WBC 8.7  HGB 15.2  HCT 43.4  PLT 123*   ------------------------------------------------------------------------------------------------------------------  Chemistries   Recent Labs Lab 12/12/15 1524  NA 141  K 3.4*  CL 109  CO2 25  GLUCOSE 147*  BUN 17  CREATININE 1.04  CALCIUM 9.3   ------------------------------------------------------------------------------------------------------------------  Cardiac Enzymes  Recent Labs Lab 12/12/15 1524  TROPONINI 0.94*   ------------------------------------------------------------------------------------------------------------------  RADIOLOGY:  Dg Chest 2 View  Result Date: 12/12/2015 CLINICAL DATA:  Left-sided chest pain and shortness of breath today. EXAM: CHEST  2 VIEW COMPARISON:  CT chest 09/10/2012. FINDINGS: The lungs are clear. Heart size is normal. No pneumothorax or pleural effusion. Aortic atherosclerosis is identified. Thoracic spondylosis is noted. IMPRESSION: No acute disease. Atherosclerosis. Electronically Signed   By: Inge Rise M.D.   On: 12/12/2015 16:45    EKG:  Normal sinus rhythm, left atrial enlargement, right axis deviation  IMPRESSION AND PLAN:    Jonathon Stevenson  is a 60 y.o. male with a known history of Coronary artery  disease status post PTCA 1994 to his RCA and stenting procedure in 2000 for 2 and a normal vessel, hypertension, heavy tobacco abuse comes to the emergency room with on and off chest pain for last 3 weeks. Patient states chest pain gets worsened with lawn mowing and exertional activity at home.  1. Acute non-Q MI with history of coronary artery disease and stenting in the past -Admit to telemetry -IV heparin drip, Nitropaste, aspirin, atorvastatin, beta blockers, and amlodipine -Cardiology consultation with Dr. Humphrey Rolls -Cycle cardiac enzymes 3 -Lipid profile  2.  malignant hypertension -Patient has been noncompliant with his home meds for 1-1/2 year  -We'll start him on beta blockers and amlodipine   3. Coronary artery disease  Treatment as above   4. Heavy tobacco abuse Smoking cessation advice about 3-4 minutes spent. Patient appears motivated  5. DVT prophylaxis already on heparin drip  Discussed with cardiology and patient's family in the room  All the records are reviewed and case discussed with ED provider. Management plans discussed with the patient, family and they are in agreement.  CODE STATUS: full  TOTAL TIME TAKING CARE OF THIS PATIENT: 55 minutes.    Jonathon Stevenson M.D on 12/12/2015 at 5:27 PM  Between 7am to 6pm - Pager - [phone]  After 6pm go to [email or web address] - [place]  [facility]  Office  [phone]  CC: Primary care physician; No PCP Per Patient

## 2015-12-12 NOTE — Progress Notes (Signed)
ANTICOAGULATION CONSULT NOTE - Initial Consult  Pharmacy Consult for heparin Indication: chest pain/ACS  No Known Allergies  Patient Measurements: Height: 6' (182.9 cm) Weight: 181 lb (82.1 kg) IBW/kg (Calculated) : 77.6  Vital Signs: Temp: 97.4 F (36.3 C) (09/13 1517) Temp Source: Oral (09/13 1517) BP: 202/99 (09/13 1651) Pulse Rate: 87 (09/13 1652)  Labs:  Recent Labs  12/12/15 1524  HGB 15.2  HCT 43.4  PLT 123*  CREATININE 1.04  TROPONINI 0.94*    Estimated Creatinine Clearance: 80.8 mL/min (by C-G formula based on SCr of 1.04 mg/dL).   Medical History: Past Medical History:  Diagnosis Date  . Arthritis   . Coronary artery disease   . Hyperglycemia   . Hyperlipidemia   . Hypertension   . Skin cancer     Assessment: 60 yo male with chest pain. Pharmacy consulted for heparin dosing for chest pain/ACS. No reported anticoagulants at home.   Goal of Therapy:  Heparin level 0.3-0.7 units/ml Monitor platelets by anticoagulation protocol: Yes   Plan:  Baseline PT/INR and aPTT ordered Give 4000 units bolus x 1 Start heparin infusion at 1000 units/hr Check anti-Xa level in 6 hours and daily while on heparin Continue to monitor H&H and platelets  Nancy Fetter, PharmD Clinical Pharmacist 12/12/2015 5:06 PM

## 2015-12-12 NOTE — ED Triage Notes (Signed)
Pt arrives today with reports of chest pain and shortness of breath  Pt reports sleeping in his recliner and awakening short of breath with left sided chest pain with radiation to his back    Pt also states that he has not taken any of his heart medications or BP meds in approx 18 months

## 2015-12-13 ENCOUNTER — Encounter
Admission: EM | Disposition: A | Discharge status: Home / Self Care | Payer: Self-pay | Source: Home / Self Care | Attending: Internal Medicine

## 2015-12-13 HISTORY — PX: CARDIAC CATHETERIZATION: SHX172

## 2015-12-13 LAB — CBC
HCT: 40.9 % (ref 40.0–52.0)
Hemoglobin: 14.4 g/dL (ref 13.0–18.0)
MCH: 31.9 pg (ref 26.0–34.0)
MCHC: 35.2 g/dL (ref 32.0–36.0)
MCV: 90.5 fL (ref 80.0–100.0)
PLATELETS: 116 10*3/uL — AB (ref 150–440)
RBC: 4.52 MIL/uL (ref 4.40–5.90)
RDW: 13 % (ref 11.5–14.5)
WBC: 8.4 10*3/uL (ref 3.8–10.6)

## 2015-12-13 LAB — HEPARIN LEVEL (UNFRACTIONATED): HEPARIN UNFRACTIONATED: 0.1 [IU]/mL — AB (ref 0.30–0.70)

## 2015-12-13 LAB — TROPONIN I
TROPONIN I: 0.67 ng/mL — AB (ref <0.03)
TROPONIN I: 0.97 ng/mL — AB (ref <0.03)

## 2015-12-13 SURGERY — LEFT HEART CATH AND CORONARY ANGIOGRAPHY
Anesthesia: Moderate Sedation | Laterality: Right

## 2015-12-13 MED ORDER — SODIUM CHLORIDE 0.9% FLUSH: 3.0000 mL | INTRAVENOUS | Status: DC | PRN

## 2015-12-13 MED ORDER — FENTANYL CITRATE (PF) 100 MCG/2ML IJ SOLN
INTRAMUSCULAR | Status: AC
  Filled 2015-12-13: qty 2

## 2015-12-13 MED ORDER — SODIUM CHLORIDE 0.9 % WEIGHT BASED INFUSION: 3.0000 mL/kg/h | INTRAVENOUS | Status: DC

## 2015-12-13 MED ORDER — ACETAMINOPHEN 325 MG PO TABS: 650.0000 mg | ORAL_TABLET | ORAL | Status: DC | PRN

## 2015-12-13 MED ORDER — HEPARIN BOLUS VIA INFUSION
2450.0000 [IU] | Freq: Once | INTRAVENOUS | Status: AC
  Administered 2015-12-13: 2450 [IU] via INTRAVENOUS
  Filled 2015-12-13: qty 2450

## 2015-12-13 MED ORDER — HEPARIN (PORCINE) IN NACL 2-0.9 UNIT/ML-% IJ SOLN
INTRAMUSCULAR | Status: AC
  Filled 2015-12-13: qty 500

## 2015-12-13 MED ORDER — HEPARIN BOLUS VIA INFUSION
2500.0000 [IU] | Freq: Once | INTRAVENOUS | Status: AC
  Administered 2015-12-13: 2500 [IU] via INTRAVENOUS
  Filled 2015-12-13: qty 2500

## 2015-12-13 MED ORDER — MIDAZOLAM HCL 2 MG/2ML IJ SOLN
INTRAMUSCULAR | Status: AC
  Filled 2015-12-13: qty 2

## 2015-12-13 MED ORDER — SODIUM CHLORIDE 0.9% FLUSH
3.0000 mL | Freq: Two times a day (BID) | INTRAVENOUS | Status: DC
  Administered 2015-12-13: 3 mL via INTRAVENOUS

## 2015-12-13 MED ORDER — ONDANSETRON HCL 4 MG/2ML IJ SOLN: 4.0000 mg | Freq: Four times a day (QID) | INTRAMUSCULAR | Status: DC | PRN

## 2015-12-13 MED ORDER — LIDOCAINE HCL (PF) 1 % IJ SOLN
INTRAMUSCULAR | Status: AC
  Filled 2015-12-13: qty 30

## 2015-12-13 MED ORDER — ASPIRIN 81 MG PO CHEW: 81.0000 mg | CHEWABLE_TABLET | ORAL | Status: DC

## 2015-12-13 MED ORDER — HEPARIN (PORCINE) IN NACL 100-0.45 UNIT/ML-% IJ SOLN
1600.0000 [IU]/h | INTRAMUSCULAR | Status: DC
  Administered 2015-12-13 (×2): 1600 [IU]/h via INTRAVENOUS
  Filled 2015-12-13: qty 250

## 2015-12-13 MED ORDER — IOPAMIDOL (ISOVUE-300) INJECTION 61%
INTRAVENOUS | Status: DC | PRN
  Administered 2015-12-13: 150 mL via INTRAVENOUS

## 2015-12-13 MED ORDER — SODIUM CHLORIDE 0.9 % WEIGHT BASED INFUSION: 1.0000 mL/kg/h | INTRAVENOUS | Status: DC

## 2015-12-13 MED ORDER — SODIUM CHLORIDE 0.9 % IV SOLN: 250.0000 mL | INTRAVENOUS | Status: DC | PRN

## 2015-12-13 MED ORDER — MIDAZOLAM HCL 2 MG/2ML IJ SOLN
INTRAMUSCULAR | Status: DC | PRN
  Administered 2015-12-13 (×2): 1 mg via INTRAVENOUS

## 2015-12-13 MED ORDER — SODIUM CHLORIDE 0.9% FLUSH
3.0000 mL | Freq: Two times a day (BID) | INTRAVENOUS | Status: DC
  Administered 2015-12-13 – 2015-12-14 (×2): 3 mL via INTRAVENOUS

## 2015-12-13 MED ORDER — SODIUM CHLORIDE 0.9 % WEIGHT BASED INFUSION
1.0000 mL/kg/h | INTRAVENOUS | Status: AC
  Administered 2015-12-13: 1 mL/kg/h via INTRAVENOUS

## 2015-12-13 MED ORDER — FENTANYL CITRATE (PF) 100 MCG/2ML IJ SOLN
INTRAMUSCULAR | Status: DC | PRN
  Administered 2015-12-13 (×2): 25 ug via INTRAVENOUS

## 2015-12-13 SURGICAL SUPPLY — 9 items
CATH INFINITI 5FR ANG PIGTAIL (CATHETERS) ×3 IMPLANT
CATH INFINITI 5FR JL4 (CATHETERS) ×3 IMPLANT
CATH INFINITI JR4 5F (CATHETERS) ×3 IMPLANT
DEVICE CLOSURE MYNXGRIP 5F (Vascular Products) ×3 IMPLANT
KIT MANI 3VAL PERCEP (MISCELLANEOUS) ×3 IMPLANT
NEEDLE PERC 18GX7CM (NEEDLE) ×3 IMPLANT
PACK CARDIAC CATH (CUSTOM PROCEDURE TRAY) ×3 IMPLANT
SHEATH PINNACLE 5F 10CM (SHEATH) ×3 IMPLANT
WIRE EMERALD 3MM-J .035X150CM (WIRE) ×3 IMPLANT

## 2015-12-13 NOTE — Care Management (Signed)
Patient presented with chest pain and admitted with nstemi.  He had previous cardiac history and has not followed up with any physician or taken any meds in the last 2-3 years due to the lack of finances.  He has recently received medicaid and social security checks within the last 2 months.  He is going to ask [facility] patient.  Discussed that this will have to be changed wit his medicaid because [facility] is listed as PCP.  Patient says he can not go back to that clinic because he owes them money.  Patient has the support of several siblings.  Discussed tat caremanager will follow for discharge meds to insure medications are on the preferred list and initiate prior auth

## 2015-12-13 NOTE — Progress Notes (Signed)
Report to abigail telemetry.  Check right groin for bleeding or hematoma.  Patient will be on bedrest for 1 hours post sheath pull---out of bed at 18:10.  Bilateral pulses are 2's DP's.Marland Kitchen

## 2015-12-13 NOTE — Progress Notes (Signed)
ANTICOAGULATION CONSULT NOTE - Follow up [place] for heparin Indication: chest pain/ACS  No Known Allergies  Patient Measurements: Height: 6' (182.9 cm) Weight: 181 lb (82.1 kg) IBW/kg (Calculated) : 77.6  Vital Signs: Temp: 98 F (36.7 C) (09/14 0838) Temp Source: Oral (09/14 0838) BP: 155/84 (09/14 0838) Pulse Rate: 85 (09/14 0838)  Labs:  Recent Labs  12/12/15 1524 12/12/15 1952 12/13/15 0053 12/13/15 0641 12/13/15 0856  HGB 15.2  --   --   --  14.4  HCT 43.4  --   --   --  40.9  PLT 123*  --   --   --  116*  APTT 30  --   --   --   --   LABPROT 13.4  --   --   --   --   INR 1.02  --   --   --   --   HEPARINUNFRC  --   --  <0.10*  --  0.10*  CREATININE 1.04  --   --   --   --   TROPONINI 0.94* 1.12* 0.97* 0.67*  --     Estimated Creatinine Clearance: 80.8 mL/min (by C-G formula based on SCr of 1.04 mg/dL).   Medical History: Past Medical History:  Diagnosis Date  . Arthritis   . Coronary artery disease   . Hyperglycemia   . Hyperlipidemia   . Hypertension   . Skin cancer     Assessment: 60 yo male with chest pain. Pharmacy consulted for heparin dosing for chest pain/ACS. No reported anticoagulants at home.   Goal of Therapy:  Heparin level 0.3-0.7 units/ml Monitor platelets by anticoagulation protocol: Yes   Plan:  Baseline PT/INR and aPTT ordered Give 4000 units bolus x 1 Start heparin infusion at 1000 units/hr Check anti-Xa level in 6 hours and daily while on heparin Continue to monitor H&H and platelets  12/13/15 0053 heparin level subtherapeutic. 2450 units IV x 1 bolus and increase rate to 1250 units/hr. Will recheck level in 6 hours.  9/14: HL @ 0856= 0.10. Will give Heparin bolus of 2500 units and increase drip to 1600 units/hr. REcheck Heparin Level in 6 hrs.   Chinita Greenland PharmD Clinical Pharmacist 12/13/2015 10:20 AM

## 2015-12-13 NOTE — Progress Notes (Signed)
Jonathon Stevenson is a 60 y.o. male  [id number]  Primary Cardiologist: Neoma Laming Reason for Consultation: Non-STEMI  HPI: This is a 60 year old white male with a past medical history of PCI and stenting haven't seen a cardiologist for 4 years presented to the hospital with chest pain for the past few weeks which gradually got worse yesterday had 2 episodes which would not go away thus he came to the emergency room. He still having intermittent chest pain.   Review of Systems: Does have orthopnea PND and leg swelling.   Past Medical History:  Diagnosis Date  . Arthritis   . Coronary artery disease   . Hyperglycemia   . Hyperlipidemia   . Hypertension   . Skin cancer     Medications Prior to Admission  Medication Sig Dispense Refill  . amLODipine (NORVASC) 5 MG tablet Take 5 mg by mouth daily.    Marland Kitchen aspirin 81 MG chewable tablet Chew 81 mg by mouth daily.    Marland Kitchen atenolol (TENORMIN) 100 MG tablet Take 100 mg by mouth daily.    Marland Kitchen lisinopril-hydrochlorothiazide (PRINZIDE,ZESTORETIC) 20-25 MG tablet Take 1 tablet by mouth daily.    Marland Kitchen omeprazole (PRILOSEC) 40 MG capsule Take 40 mg by mouth daily.    . rosuvastatin (CRESTOR) 20 MG tablet Take 20 mg by mouth daily.       Marland Kitchen amLODipine  5 mg Oral Daily  . aspirin EC  81 mg Oral Daily  . atorvastatin  40 mg Oral q1800  . Influenza vac split quadrivalent PF  0.5 mL Intramuscular Tomorrow-1000  . metoprolol tartrate  25 mg Oral BID  . nitroGLYCERIN  0.5 inch Topical Q6H  . pneumococcal 23 valent vaccine  0.5 mL Intramuscular Tomorrow-1000    Infusions: . heparin 1,250 Units/hr (12/13/15 0320)    No Known Allergies  Social History   Social History  . Marital status: Married    Spouse name: N/A  . Number of children: N/A  . Years of education: N/A   Occupational History  . Not on file.   Social History Main Topics  . Smoking status: Current Every Day Smoker    Packs/day: 2.00    Types: Cigarettes  . Smokeless  tobacco: Never Used  . Alcohol use 4.2 oz/week    7 Cans of beer per week  . Drug use: No  . Sexual activity: Not on file   Other Topics Concern  . Not on file   Social History Narrative  . No narrative on file    Family History  Problem Relation Age of Onset  . Heart disease Mother   . Heart disease Sister   . Hypertension Sister     PHYSICAL EXAM: Vitals:   12/13/15 0514 12/13/15 0838  BP: (!) 152/95 (!) 155/84  Pulse: 90 85  Resp: 15 17  Temp: 97.8 F (36.6 C) 98 F (36.7 C)     Intake/Output Summary (Last 24 hours) at 12/13/15 0931 Last data filed at 12/13/15 [id number]  Gross per 24 hour  Intake           101.84 ml  Output              300 ml  Net          -198.16 ml    General:  Well appearing. No respiratory difficulty HEENT: normal Neck: supple. no JVD. Carotids 2+ bilat; no bruits. No lymphadenopathy or thryomegaly appreciated. Cor: PMI nondisplaced. Regular rate & rhythm. No rubs,  gallops or murmurs. Lungs: clear Abdomen: soft, nontender, nondistended. No hepatosplenomegaly. No bruits or masses. Good bowel sounds. Extremities: no cyanosis, clubbing, rash, edema Neuro: alert & oriented x 3, cranial nerves grossly intact. moves all 4 extremities w/o difficulty. Affect pleasant.  [id number] sinus rhythm no acute changes  Results for orders placed or performed during the hospital encounter of 12/12/15 (from the past 24 hour(s))  Basic metabolic panel     Status: Abnormal   Collection Time: 12/12/15  3:24 PM  Result Value Ref Range   Sodium 141 135 - 145 mmol/L   Potassium 3.4 (L) 3.5 - 5.1 mmol/L   Chloride 109 101 - 111 mmol/L   CO2 25 22 - 32 mmol/L   Glucose, Bld 147 (H) 65 - 99 mg/dL   BUN 17 6 - 20 mg/dL   Creatinine, Ser 1.04 0.61 - 1.24 mg/dL   Calcium 9.3 8.9 - 10.3 mg/dL   GFR calc non Af Amer >60 >60 mL/min   GFR calc Af Amer >60 >60 mL/min   Anion gap 7 5 - 15  CBC     Status: Abnormal   Collection Time: 12/12/15  3:24 PM  Result Value  Ref Range   WBC 8.7 3.8 - 10.6 K/uL   RBC 4.72 4.40 - 5.90 MIL/uL   Hemoglobin 15.2 13.0 - 18.0 g/dL   HCT 43.4 40.0 - 52.0 %   MCV 91.8 80.0 - 100.0 fL   MCH 32.1 26.0 - 34.0 pg   MCHC 35.0 32.0 - 36.0 g/dL   RDW 13.3 11.5 - 14.5 %   Platelets 123 (L) 150 - 440 K/uL  Troponin I     Status: Abnormal   Collection Time: 12/12/15  3:24 PM  Result Value Ref Range   Troponin I 0.94 (HH) <0.03 ng/mL  APTT     Status: None   Collection Time: 12/12/15  3:24 PM  Result Value Ref Range   aPTT 30 24 - 36 seconds  Protime-INR     Status: None   Collection Time: 12/12/15  3:24 PM  Result Value Ref Range   Prothrombin Time 13.4 11.4 - 15.2 seconds   INR 1.02   Troponin I     Status: Abnormal   Collection Time: 12/12/15  7:52 PM  Result Value Ref Range   Troponin I 1.12 (HH) <0.03 ng/mL  Heparin level (unfractionated)     Status: Abnormal   Collection Time: 12/13/15 12:53 AM  Result Value Ref Range   Heparin Unfractionated <0.10 (L) 0.30 - 0.70 IU/mL  Troponin I     Status: Abnormal   Collection Time: 12/13/15 12:53 AM  Result Value Ref Range   Troponin I 0.97 (HH) <0.03 ng/mL  Troponin I     Status: Abnormal   Collection Time: 12/13/15  6:41 AM  Result Value Ref Range   Troponin I 0.67 (HH) <0.03 ng/mL  CBC     Status: Abnormal   Collection Time: 12/13/15  8:56 AM  Result Value Ref Range   WBC 8.4 3.8 - 10.6 K/uL   RBC 4.52 4.40 - 5.90 MIL/uL   Hemoglobin 14.4 13.0 - 18.0 g/dL   HCT 40.9 40.0 - 52.0 %   MCV 90.5 80.0 - 100.0 fL   MCH 31.9 26.0 - 34.0 pg   MCHC 35.2 32.0 - 36.0 g/dL   RDW 13.0 11.5 - 14.5 %   Platelets 116 (L) 150 - 440 K/uL   Dg Chest 2 View  Result Date: 12/12/2015 CLINICAL  DATA:  Left-sided chest pain and shortness of breath today. EXAM: CHEST  2 VIEW COMPARISON:  CT chest 09/10/2012. FINDINGS: The lungs are clear. Heart size is normal. No pneumothorax or pleural effusion. Aortic atherosclerosis is identified. Thoracic spondylosis is noted. IMPRESSION: No  acute disease. Atherosclerosis. Electronically Signed   By: Inge Rise M.D.   On: 12/12/2015 16:45     ASSESSMENT AND PLAN:Non-STEMI with acute coronary syndrome with history of PCI and stenting 4 years ago presents now with chest pain which is not getting better. He has ruled in for myocardial infarction and patient was explained the risk and benefits of cardiac catheterization and patient has agreed to have cardiac catheterization.  Jasiyah Paulding A

## 2015-12-13 NOTE — Progress Notes (Signed)
ANTICOAGULATION CONSULT NOTE - Initial Consult  Pharmacy Consult for heparin Indication: chest pain/ACS  No Known Allergies  Patient Measurements: Height: 6' (182.9 cm) Weight: 181 lb (82.1 kg) IBW/kg (Calculated) : 77.6  Vital Signs: Temp: 97.7 F (36.5 C) (09/13 1925) Temp Source: Oral (09/13 1925) BP: 171/85 (09/13 1925) Pulse Rate: 83 (09/13 1925)  Labs:  Recent Labs  12/12/15 1524 12/12/15 1952 12/13/15 0053  HGB 15.2  --   --   HCT 43.4  --   --   PLT 123*  --   --   APTT 30  --   --   LABPROT 13.4  --   --   INR 1.02  --   --   HEPARINUNFRC  --   --  <0.10*  CREATININE 1.04  --   --   TROPONINI 0.94* 1.12* 0.97*    Estimated Creatinine Clearance: 80.8 mL/min (by C-G formula based on SCr of 1.04 mg/dL).   Medical History: Past Medical History:  Diagnosis Date  . Arthritis   . Coronary artery disease   . Hyperglycemia   . Hyperlipidemia   . Hypertension   . Skin cancer     Assessment: 60 yo male with chest pain. Pharmacy consulted for heparin dosing for chest pain/ACS. No reported anticoagulants at home.   Goal of Therapy:  Heparin level 0.3-0.7 units/ml Monitor platelets by anticoagulation protocol: Yes   Plan:  Baseline PT/INR and aPTT ordered Give 4000 units bolus x 1 Start heparin infusion at 1000 units/hr Check anti-Xa level in 6 hours and daily while on heparin Continue to monitor H&H and platelets  12/13/15 0053 heparin level subtherapeutic. 2450 units IV x 1 bolus and increase rate to 1250 units/hr. Will recheck level in 6 hours.  China Deitrick A. [place], [place].D., BCPS Clinical Pharmacist 12/13/2015 2:33 AM

## 2015-12-13 NOTE — Progress Notes (Signed)
[facility] NAME: Jonathon Stevenson    MR#:  [id number]  DATE OF BIRTH:  May 26, 1955  SUBJECTIVE:   Patient presented with chest pain. No chest pain since admission to the hospital. REVIEW OF SYSTEMS:    Review of Systems  Constitutional: Negative.  Negative for chills, fever and malaise/fatigue.  HENT: Negative.  Negative for ear discharge, ear pain, hearing loss, nosebleeds and sore throat.   Eyes: Negative.  Negative for blurred vision and pain.  Respiratory: Negative.  Negative for cough, hemoptysis, shortness of breath and wheezing.   Cardiovascular: Negative.  Negative for chest pain, palpitations and leg swelling.  Gastrointestinal: Negative.  Negative for abdominal pain, blood in stool, diarrhea, nausea and vomiting.  Genitourinary: Negative.  Negative for dysuria.  Musculoskeletal: Negative.  Negative for back pain.  Skin: Negative.   Neurological: Negative for dizziness, tremors, speech change, focal weakness, seizures and headaches.  Endo/Heme/Allergies: Negative.  Does not bruise/bleed easily.  Psychiatric/Behavioral: Negative.  Negative for depression, hallucinations and suicidal ideas.    Tolerating Diet:Nothing by mouth      DRUG ALLERGIES:  No Known Allergies  VITALS:  Blood pressure (!) 155/84, pulse 85, temperature 98 F (36.7 C), temperature source Oral, resp. rate 17, height 6' (1.829 m), weight 82.1 kg (181 lb), SpO2 91 %.  PHYSICAL EXAMINATION:   Physical Exam  Constitutional: He is oriented to person, place, and time and well-developed, well-nourished, and in no distress. No distress.  HENT:  Head: Normocephalic.  Eyes: No scleral icterus.  Neck: Normal range of motion. Neck supple. No JVD present. No tracheal deviation present.  Cardiovascular: Normal rate, regular rhythm and normal heart sounds.  Exam reveals no gallop and no friction rub.   No murmur heard. Pulmonary/Chest: Effort normal and  breath sounds normal. No respiratory distress. He has no wheezes. He has no rales. He exhibits no tenderness.  Abdominal: Soft. Bowel sounds are normal. He exhibits no distension and no mass. There is no tenderness. There is no rebound and no guarding.  Musculoskeletal: Normal range of motion. He exhibits no edema.  Neurological: He is alert and oriented to person, place, and time.  Skin: Skin is warm. No rash noted. No erythema.  tattoos on arm  Psychiatric: Affect and judgment normal.      LABORATORY PANEL:   CBC  Recent Labs Lab 12/13/15 0856  WBC 8.4  HGB 14.4  HCT 40.9  PLT 116*   ------------------------------------------------------------------------------------------------------------------  Chemistries   Recent Labs Lab 12/12/15 1524  NA 141  K 3.4*  CL 109  CO2 25  GLUCOSE 147*  BUN 17  CREATININE 1.04  CALCIUM 9.3   ------------------------------------------------------------------------------------------------------------------  Cardiac Enzymes  Recent Labs Lab 12/12/15 1952 12/13/15 0053 12/13/15 0641  TROPONINI 1.12* 0.97* 0.67*   ------------------------------------------------------------------------------------------------------------------  RADIOLOGY:  Dg Chest 2 View  Result Date: 12/12/2015 CLINICAL DATA:  Left-sided chest pain and shortness of breath today. EXAM: CHEST  2 VIEW COMPARISON:  CT chest 09/10/2012. FINDINGS: The lungs are clear. Heart size is normal. No pneumothorax or pleural effusion. Aortic atherosclerosis is identified. Thoracic spondylosis is noted. IMPRESSION: No acute disease. Atherosclerosis. Electronically Signed   By: Inge Rise M.D.   On: 12/12/2015 16:45     ASSESSMENT AND PLAN:   60 year old male with ASCVD status post stent in 1994, tobacco dependence and essential hypertension not on medications at home due to noncompliance who presents with non-STEMI.  1. Non-STEMI with history of ASCVD and  cardiac  stent: Plan for cardiac catheterization today. Continue heparin drip, aspirin, atorvastatin, Norvasc and metoprolol.  2. Essential hypertension: Patient has not been taking medications over the past year and a half. Continue Norvasc and metoprolol. Follow blood pressure. Goal  blood pressure less than 140/90.  3. ASCVD: Plan as above.  4. Tobacco dependence: Patient counseled by admitting physician again by myself. He is motivated to quit.   Management plans discussed with the patient and he is in agreement. Discussed with Dr. Leonette Monarch with nursing staff today. CODE STATUS: full  TOTAL TIME TAKING CARE OF THIS PATIENT: 30 minutes.     POSSIBLE D/C tomorrow, DEPENDING ON CLINICAL CONDITION.   Carnel Stegman M.D on 12/13/2015 at 11:35 AM  Between 7am to 6pm - Pager - [phone] After 6pm go to [email or web address] - password [email or web address] [facility]  Office  [phone]  CC: Primary care physician; No PCP Per Patient  Note: This dictation was prepared with Dragon dictation along with smaller phrase technology. Any transcriptional errors that result from this process are unintentional.

## 2015-12-14 ENCOUNTER — Encounter: Payer: Self-pay | Admitting: Cardiovascular Disease

## 2015-12-14 LAB — BASIC METABOLIC PANEL
ANION GAP: 6 (ref 5–15)
BUN: 13 mg/dL (ref 6–20)
CALCIUM: 8.6 mg/dL — AB (ref 8.9–10.3)
CO2: 26 mmol/L (ref 22–32)
CREATININE: 0.79 mg/dL (ref 0.61–1.24)
Chloride: 106 mmol/L (ref 101–111)
Glucose, Bld: 94 mg/dL (ref 65–99)
POTASSIUM: 3.8 mmol/L (ref 3.5–5.1)
Sodium: 138 mmol/L (ref 135–145)

## 2015-12-14 LAB — CBC
HEMATOCRIT: 39.9 % — AB (ref 40.0–52.0)
HEMOGLOBIN: 14.3 g/dL (ref 13.0–18.0)
MCH: 32.3 pg (ref 26.0–34.0)
MCHC: 35.9 g/dL (ref 32.0–36.0)
MCV: 90 fL (ref 80.0–100.0)
Platelets: 117 10*3/uL — ABNORMAL LOW (ref 150–440)
RBC: 4.43 MIL/uL (ref 4.40–5.90)
RDW: 13.1 % (ref 11.5–14.5)
WBC: 8.9 10*3/uL (ref 3.8–10.6)

## 2015-12-14 MED ORDER — ATORVASTATIN CALCIUM 40 MG PO TABS: 40.0000 mg | ORAL_TABLET | Freq: Every day | ORAL | 0 refills | Status: AC

## 2015-12-14 MED ORDER — METOPROLOL TARTRATE 25 MG PO TABS: 25.0000 mg | ORAL_TABLET | Freq: Two times a day (BID) | ORAL | 0 refills | Status: AC

## 2015-12-14 NOTE — Discharge Instructions (Signed)
Acute Coronary Syndrome °Acute coronary syndrome (ACS) is a serious problem in which there is suddenly not enough blood and oxygen supplied to the heart. ACS may mean that one or more of the blood vessels in your heart (coronary arteries) may be blocked. ACS can result in chest pain or a heart attack (myocardial infarction or MI). °CAUSES °This condition is caused by atherosclerosis, which is the buildup of fat and cholesterol (plaque) on the inside of the arteries. Over time, the plaque may narrow or block the artery, and this will lessen blood flow to the heart. Plaque can also become weak and break off within a coronary artery to form a clot and cause a sudden blockage. °RISK FACTORS °The risks factors of this condition include: °· High cholesterol levels. °· High blood pressure (hypertension). °· Smoking. °· Diabetes. °· Age. °· Family history of chest pain, heart disease, or stroke. °· Lack of exercise. °SYMPTOMS °The most common signs of this condition include: °· Chest pain, which can be: °¨ A crushing or squeezing in the chest. °¨ A tightness, pressure, fullness, or heaviness in the chest. °¨ Present for more than a few minutes, or it can stop and recur. °· Pain in the arms, neck, jaw, or back. °· Unexplained heartburn or indigestion. °· Shortness of breath. °· Nausea. °· Sudden cold sweats. °· Feeling light-headed or dizzy. °Sometimes, this condition has no symptoms. °DIAGNOSIS °ACS may be diagnosed through the following tests: °· Electrocardiogram (ECG). °· Blood tests. °· Coronary angiogram. This is a procedure to look at the coronary arteries to see if there is any blockage. °TREATMENT °Treatment for ACS may include: °· Healthy behavioral changes to reduce or control risk factors. °· Medicine. °· Coronary stenting. A stent helps to keep an artery open. °· Coronary angioplasty. This procedure widens a narrowed or blocked artery. °· Coronary artery bypass surgery. This will allow your blood to pass the  blockage (bypass) to reach your heart. °HOME CARE INSTRUCTIONS °Eating and Drinking °· Follow a heart-healthy diet. A dietitian can you help to educate you about healthy food options and changes. °· Use healthy cooking methods such as roasting, grilling, broiling, baking, poaching, steaming, or stir-frying. Talk to a dietitian to learn more about healthy cooking methods. °Medicines °· Take medicines only as directed by your health care provider. °· Do not take the following medicines unless your health care provider approves: °¨ Nonsteroidal anti-inflammatory drugs (NSAIDs), such as ibuprofen, naproxen, or celecoxib. °¨ Vitamin supplements that contain vitamin A, vitamin E, or both. °¨ Hormone replacement therapy that contains estrogen with or without progestin. °· Stop illegal drug use. °Activities °· Follow an exercise program that is approved by your health care provider. °· Plan rest periods when you are fatigued. °Lifestyle °· Do not use any tobacco products, including cigarettes, chewing tobacco, or electronic cigarettes. If you need help quitting, ask your health care provider. °· If you drink alcohol, and your health care provider approves, limit your alcohol intake to no more than 1 drink per day. One drink equals 12 ounces of beer, 5 ounces of wine, or 1½ ounces of hard liquor. °· Learn to manage stress. °· Maintain a healthy weight. Lose weight as approved by your health care provider. °General Instructions °· Manage other health conditions, such as hypertension and diabetes, as directed by your health care provider. °· Keep all follow-up visits as directed by your health care provider. This is important. °· Your health care provider may ask you to monitor your blood   pressure. A blood pressure reading consists of a higher number over a lower number, such as 110 over 72, written as 110/72. Ideally, your blood pressure should be:  Below 140/90 if you have no other medical conditions.  Below 130/80 if  you have diabetes or kidney disease. SEEK IMMEDIATE MEDICAL CARE IF:  You have pain in your chest, neck, arm, jaw, stomach, or back that lasts more than a few minutes, is recurring, or is not relieved by taking medicine under your tongue (sublingual nitroglycerin).  You have profuse sweating without cause.  You have unexplained:  Heartburn or indigestion.  Shortness of breath or difficulty breathing.  Nausea or vomiting.  Fatigue.  Feelings of nervousness or anxiety.  Weakness.  Diarrhea.  You have sudden light-headedness or dizziness.  You faint. These symptoms may represent a serious problem that is an emergency. Do not wait to see if the symptoms will go away. Get medical help right away. Call your local emergency services (911 in the [place] Do not drive yourself to the clinic or hospital.   This information is not intended to replace advice given to you by your health care provider. Make sure you discuss any questions you have with your health care provider.   Document Released: 03/17/2005 Document Revised: 04/07/2014 Document Reviewed: 07/19/2013 Elsevier Interactive Patient Education 2016 [place] An angiogram is an X-ray test. It is used to look at your blood vessels. For this test, a dye is put into the blood vessel being checked. The dye shows up on X-rays. It helps your doctor see if there is a blockage or other problem in the blood vessel. BEFORE THE PROCEDURE  Follow your doctor's instructions about limiting what you eat or drink.  Ask your doctor if you may drink enough water to take any needed medicines the morning of the test.  Plan to have someone take you home after the test.  If you go home the same day as the test, plan to have someone stay with you for 24 hours. PROCEDURE   An IV tube will be put into one of your veins.  You will be given a medicine that makes you relax (sedative).  Your skin will be washed and shaved where the  thin tube (catheter) will be inserted. This will usually be done in the upper part of your leg (groin). It may also be done in your arm near the elbow or in your wrist.  You will be given a medicine that numbs the area where the tube will be inserted (local anesthetic).  The tube will be inserted into a blood vessel.  Using a type of X-ray (fluoroscopy) to see, your doctor will move the tube into the blood vessel to check it.  Dye will be put in through the tube. X-rays of your blood vessels will then be taken. Different health care providers and hospitals may do this procedure differently. AFTER THE PROCEDURE   If the test is done through the leg, you will be kept in bed lying flat for several hours. You will be told to not bend or cross your legs.   The area where the tube was inserted will be checked often.   The pulse in your feet or wrist will be checked often.   More tests or X-rays may be done.    This information is not intended to replace advice given to you by your health care provider. Make sure you discuss any questions you have with  your health care provider.   Document Released: 06/13/2008 Document Revised: 04/07/2014 Document Reviewed: 08/18/2012 Elsevier Interactive Patient Education 2016 [employer].   Aspirin and Your Heart  Aspirin is a medicine that affects the way blood clots. Aspirin can be used to help reduce the risk of blood clots, heart attacks, and other heart-related problems.  SHOULD I TAKE ASPIRIN? Your health care provider will help you determine whether it is safe and beneficial for you to take aspirin daily. Taking aspirin daily may be beneficial if you:  Have had a heart attack or chest pain.  Have undergone open heart surgery such as coronary artery bypass surgery (CABG).  Have had coronary angioplasty.  Have experienced a stroke or transient ischemic attack (TIA).  Have peripheral vascular disease (PVD).  Have chronic heart rhythm  problems such as atrial fibrillation. ARE THERE ANY RISKS OF TAKING ASPIRIN DAILY? Daily use of aspirin can increase your risk of side effects. Some of these include:  Bleeding. Bleeding problems can be minor or serious. An example of a minor problem is a cut that does not stop bleeding. An example of a more serious problem is stomach bleeding or bleeding into the brain. Your risk of bleeding is increased if you are also taking non-steroidal anti-inflammatory medicine (NSAIDs).  Increased bruising.  Upset stomach.  An allergic reaction. People who have nasal polyps have an increased risk of developing an aspirin allergy. WHAT ARE SOME GUIDELINES I SHOULD FOLLOW WHEN TAKING ASPIRIN?   Take aspirin only as directed by your health care provider. Make sure you understand how much you should take and what form you should take. The two forms of aspirin are:  Non-enteric-coated. This type of aspirin does not have a coating and is absorbed quickly. Non-enteric-coated aspirin is usually recommended for people with chest pain. This type of aspirin also comes in a chewable form.  Enteric-coated. This type of aspirin has a special coating that releases the medicine very slowly. Enteric-coated aspirin causes less stomach upset than non-enteric-coated aspirin. This type of aspirin should not be chewed or crushed.  Drink alcohol in moderation. Drinking alcohol increases your risk of bleeding. WHEN SHOULD I SEEK MEDICAL CARE?   You have unusual bleeding or bruising.  You have stomach pain.  You have an allergic reaction. Symptoms of an allergic reaction include:  Hives.  Itchy skin.  Swelling of the lips, tongue, or face.  You have ringing in your ears. WHEN SHOULD I SEEK IMMEDIATE MEDICAL CARE?   Your bowel movements are bloody, dark red, or black in color.  You vomit or cough up blood.  You have blood in your urine.  You cough, wheeze, or feel short of breath. If you have any of the  following symptoms, this is an emergency. Do not wait to see if the pain will go away. Get medical help at once. Call your local emergency services (911 in the [place] Do not drive yourself to the hospital.  You have severe chest pain, especially if the pain is crushing or pressure-like and spreads to the arms, back, neck, or jaw.  You have stroke-like symptoms, such as:   Loss of vision.   Difficulty talking.   Numbness or weakness on one side of your body.   Numbness or weakness in your arm or leg.   Not thinking clearly or feeling confused.    This information is not intended to replace advice given to you by your health care provider. Make sure you discuss any  questions you have with your health care provider.   Document Released: 02/28/2008 Document Revised: 04/07/2014 Document Reviewed: 06/22/2013 Elsevier Interactive Patient Education 2016 [employer].  Hypertension Hypertension, commonly called high blood pressure, is when the force of blood pumping through your arteries is too strong. Your arteries are the blood vessels that carry blood from your heart throughout your body. A blood pressure reading consists of a higher number over a lower number, such as 110/72. The higher number (systolic) is the pressure inside your arteries when your heart pumps. The lower number (diastolic) is the pressure inside your arteries when your heart relaxes. Ideally you want your blood pressure below 120/80. Hypertension forces your heart to work harder to pump blood. Your arteries may become narrow or stiff. Having untreated or uncontrolled hypertension can cause heart attack, stroke, kidney disease, and other problems. RISK FACTORS Some risk factors for high blood pressure are controllable. Others are not.  Risk factors you cannot control include:   Race. You may be at higher risk if you are African American.  Age. Risk increases with age.  Gender. Men are at higher risk than women  before age 49 years. After age 76, women are at higher risk than men. Risk factors you can control include:  Not getting enough exercise or physical activity.  Being overweight.  Getting too much fat, sugar, calories, or salt in your diet.  Drinking too much alcohol. SIGNS AND SYMPTOMS Hypertension does not usually cause signs or symptoms. Extremely high blood pressure (hypertensive crisis) may cause headache, anxiety, shortness of breath, and nosebleed. DIAGNOSIS To check if you have hypertension, your health care provider will measure your blood pressure while you are seated, with your arm held at the level of your heart. It should be measured at least twice using the same arm. Certain conditions can cause a difference in blood pressure between your right and left arms. A blood pressure reading that is higher than normal on one occasion does not mean that you need treatment. If it is not clear whether you have high blood pressure, you may be asked to return on a different day to have your blood pressure checked again. Or, you may be asked to monitor your blood pressure at home for 1 or more weeks. TREATMENT Treating high blood pressure includes making lifestyle changes and possibly taking medicine. Living a healthy lifestyle can help lower high blood pressure. You may need to change some of your habits. Lifestyle changes may include:  Following the DASH diet. This diet is high in fruits, vegetables, and whole grains. It is low in salt, red meat, and added sugars.  Keep your sodium intake below 2,300 mg per day.  Getting at least 30-45 minutes of aerobic exercise at least 4 times per week.  Losing weight if necessary.  Not smoking.  Limiting alcoholic beverages.  Learning ways to reduce stress. Your health care provider may prescribe medicine if lifestyle changes are not enough to get your blood pressure under control, and if one of the following is true:  You are 80-32 years of age  and your systolic blood pressure is above 140.  You are 74 years of age or older, and your systolic blood pressure is above 150.  Your diastolic blood pressure is above 90.  You have diabetes, and your systolic blood pressure is over [id number] or your diastolic blood pressure is over 90.  You have kidney disease and your blood pressure is above 140/90.  You have heart  disease and your blood pressure is above 140/90. Your personal target blood pressure may vary depending on your medical conditions, your age, and other factors. HOME CARE INSTRUCTIONS  Have your blood pressure rechecked as directed by your health care provider.   Take medicines only as directed by your health care provider. Follow the directions carefully. Blood pressure medicines must be taken as prescribed. The medicine does not work as well when you skip doses. Skipping doses also puts you at risk for problems.  Do not smoke.   Monitor your blood pressure at home as directed by your health care provider. SEEK MEDICAL CARE IF:   You think you are having a reaction to medicines taken.  You have recurrent headaches or feel dizzy.  You have swelling in your ankles.  You have trouble with your vision. SEEK IMMEDIATE MEDICAL CARE IF:  You develop a severe headache or confusion.  You have unusual weakness, numbness, or feel faint.  You have severe chest or abdominal pain.  You vomit repeatedly.  You have trouble breathing. MAKE SURE YOU:   Understand these instructions.  Will watch your condition.  Will get help right away if you are not doing well or get worse.   This information is not intended to replace advice given to you by your health care provider. Make sure you discuss any questions you have with your health care provider.   Document Released: 03/17/2005 Document Revised: 08/01/2014 Document Reviewed: 01/07/2013 Elsevier Interactive Patient Education [employer].

## 2015-12-14 NOTE — Care Management (Signed)
Cath findings will be managed medically. For discharge.  No issues obtaining medications.

## 2015-12-14 NOTE — Progress Notes (Signed)
Patient discharged home. All instructions reviewed and patient verbalized information back. Brother at side also verbalized information. PIV d/c'd with cath intact x2. All questions answered. No need or further questions. Patient aware to call 911 for s/s stroke or for chest pain. W/C out with volunteer services and brother at side.

## 2015-12-14 NOTE — Discharge Summary (Signed)
[facility] NAME: Jonathon Stevenson    MR#:  [id number]  DATE OF BIRTH:  1955/04/07  DATE OF ADMISSION:  12/12/2015 ADMITTING PHYSICIAN: Fritzi Mandes, MD  DATE OF DISCHARGE: 12/14/15  PRIMARY CARE PHYSICIAN: No PCP Per Patient    ADMISSION DIAGNOSIS:  NSTEMI (non-ST elevated myocardial infarction) ([place]) [I21.4] Uncontrolled hypertension [I10]  DISCHARGE DIAGNOSIS:  Active Problems:   NSTEMI (non-ST elevated myocardial infarction) ([place])   SECONDARY DIAGNOSIS:   Past Medical History:  Diagnosis Date  . Arthritis   . Coronary artery disease   . Hyperglycemia   . Hyperlipidemia   . Hypertension   . Skin cancer     HOSPITAL COURSE:  Jonathon Stevenson  is a 60 y.o. male admitted 12/12/2015 with chief complaint Chest Pain and Shortness of Breath . Please see H&P performed by Fritzi Mandes, MD for further information. Patient presented with chest pain and shortness of breath found to have elevated cardiac enzymes consistent with NSTEMI. Patient started on anticoagulation at bedside the telemetry floor evaluated by cardiology. Underwent cardiac catheterization results below   Ost LAD to Prox LAD lesion, 40 %stenosed.  Prox Cx to Mid Cx lesion, 40 %stenosed.  Prox RCA lesion, 100 %stenosed.   Stent in  Mid LCX OK, and RCA is occluded with good collaterals from LCX and normal EF.   He remained chest pain-free for the duration of his hospitalization , stable for discharge  DISCHARGE CONDITIONS:   Stable  CONSULTS OBTAINED:  Treatment Team:  Dionisio David, MD  DRUG ALLERGIES:  Not on File  DISCHARGE MEDICATIONS:   Current Discharge Medication List    START taking these medications   Details  atorvastatin (LIPITOR) 40 MG tablet Take 1 tablet (40 mg total) by mouth daily at 6 PM. Qty: 30 tablet, Refills: 0    metoprolol tartrate (LOPRESSOR) 25 MG tablet Take 1 tablet (25 mg total) by mouth 2 (two) times daily. Qty: 60  tablet, Refills: 0      CONTINUE these medications which have NOT CHANGED   Details  amLODipine (NORVASC) 5 MG tablet Take 5 mg by mouth daily.    aspirin 81 MG chewable tablet Chew 81 mg by mouth daily.    omeprazole (PRILOSEC) 40 MG capsule Take 40 mg by mouth daily.      STOP taking these medications     atenolol (TENORMIN) 100 MG tablet      lisinopril-hydrochlorothiazide (PRINZIDE,ZESTORETIC) 20-25 MG tablet      rosuvastatin (CRESTOR) 20 MG tablet          DISCHARGE INSTRUCTIONS:    DIET:  Regular diet  DISCHARGE CONDITION:  Stable  ACTIVITY:  Activity as tolerated  OXYGEN:  Home Oxygen: No.   Oxygen Delivery: room air  DISCHARGE LOCATION:  home   If you experience worsening of your admission symptoms, develop shortness of breath, life threatening emergency, suicidal or homicidal thoughts you must seek medical attention immediately by calling 911 or calling your MD immediately  if symptoms less severe.  You Must read complete instructions/literature along with all the possible adverse reactions/side effects for all the Medicines you take and that have been prescribed to you. Take any new Medicines after you have completely understood and accpet all the possible adverse reactions/side effects.   Please note  You were cared for by a hospitalist during your hospital stay. If you have any questions about your discharge medications or the care you received while  you were in the hospital after you are discharged, you can call the unit and asked to speak with the hospitalist on call if the hospitalist that took care of you is not available. Once you are discharged, your primary care physician will handle any further medical issues. Please note that NO REFILLS for any discharge medications will be authorized once you are discharged, as it is imperative that you return to your primary care physician (or establish a relationship with a primary care physician if you do  not have one) for your aftercare needs so that they can reassess your need for medications and monitor your lab values.    On the day of Discharge:   VITAL SIGNS:  Blood pressure (!) 170/91, pulse 78, temperature 98.4 F (36.9 C), temperature source Oral, resp. rate 16, height 6' (1.829 m), weight 82.1 kg (181 lb), SpO2 97 %.  I/O:   Intake/Output Summary (Last 24 hours) at 12/14/15 1115 Last data filed at 12/14/15 1018  Gross per 24 hour  Intake           614.73 ml  Output             1750 ml  Net         -1135.27 ml    PHYSICAL EXAMINATION:  GENERAL:  60 y.o.-year-old patient lying in the bed with no acute distress.  EYES: Pupils equal, round, reactive to light and accommodation. No scleral icterus. Extraocular muscles intact.  HEENT: Head atraumatic, normocephalic. Oropharynx and nasopharynx clear.  NECK:  Supple, no jugular venous distention. No thyroid enlargement, no tenderness.  LUNGS: Normal breath sounds bilaterally, no wheezing, rales,rhonchi or crepitation. No use of accessory muscles of respiration.  CARDIOVASCULAR: S1, S2 normal. No murmurs, rubs, or gallops.  ABDOMEN: Soft, non-tender, non-distended. Bowel sounds present. No organomegaly or mass.  EXTREMITIES: No pedal edema, cyanosis, or clubbing.  NEUROLOGIC: Cranial nerves II through XII are intact. Muscle strength 5/5 in all extremities. Sensation intact. Gait not checked.  PSYCHIATRIC: The patient is alert and oriented x 3.  SKIN: No obvious rash, lesion, or ulcer.   DATA REVIEW:   CBC  Recent Labs Lab 12/14/15 0456  WBC 8.9  HGB 14.3  HCT 39.9*  PLT 117*    Chemistries   Recent Labs Lab 12/14/15 0456  NA 138  K 3.8  CL 106  CO2 26  GLUCOSE 94  BUN 13  CREATININE 0.79  CALCIUM 8.6*    Cardiac Enzymes  Recent Labs Lab 12/13/15 0641  TROPONINI 0.67*    Microbiology Results  No results found for this or any previous visit.  RADIOLOGY:  Dg Chest 2 View  Result Date:  12/12/2015 CLINICAL DATA:  Left-sided chest pain and shortness of breath today. EXAM: CHEST  2 VIEW COMPARISON:  CT chest 09/10/2012. FINDINGS: The lungs are clear. Heart size is normal. No pneumothorax or pleural effusion. Aortic atherosclerosis is identified. Thoracic spondylosis is noted. IMPRESSION: No acute disease. Atherosclerosis. Electronically Signed   By: Inge Rise M.D.   On: 12/12/2015 16:45     Management plans discussed with the patient, family and they are in agreement.  CODE STATUS:     Code Status Orders        Start     Ordered   12/13/15 1840  Full code  Continuous     12/13/15 1840    Code Status History    Date Active Date Inactive Code Status Order ID Comments User Context   12/12/2015  6:54 PM 12/12/2015  7:55 PM Full Code [id number]  Fritzi Mandes, MD Inpatient      TOTAL TIME TAKING CARE OF THIS PATIENT: 33 minutes.    Hower,  Karenann Cai.D on 12/14/2015 at 11:15 AM  Between 7am to 6pm - Pager - [phone]  After 6pm go to [email or web address] - [employer] [facility]  [facility]  Office  [phone]  CC: Primary care physician; No PCP Per Patient

## 2016-12-29 DEATH — deceased

## 2017-11-08 IMAGING — CR DG CHEST 2V
1 series · 2 of 2 positions shown · non-contrast
Comparison: CT chest 09/10/2012.

CLINICAL DATA: Left-sided chest pain and shortness of breath today.

EXAM:
CHEST  2 VIEW

[Series 1: dg chest 2 view · 0.14mm/px · 2 of 2 slices shown]
[im 1/2]
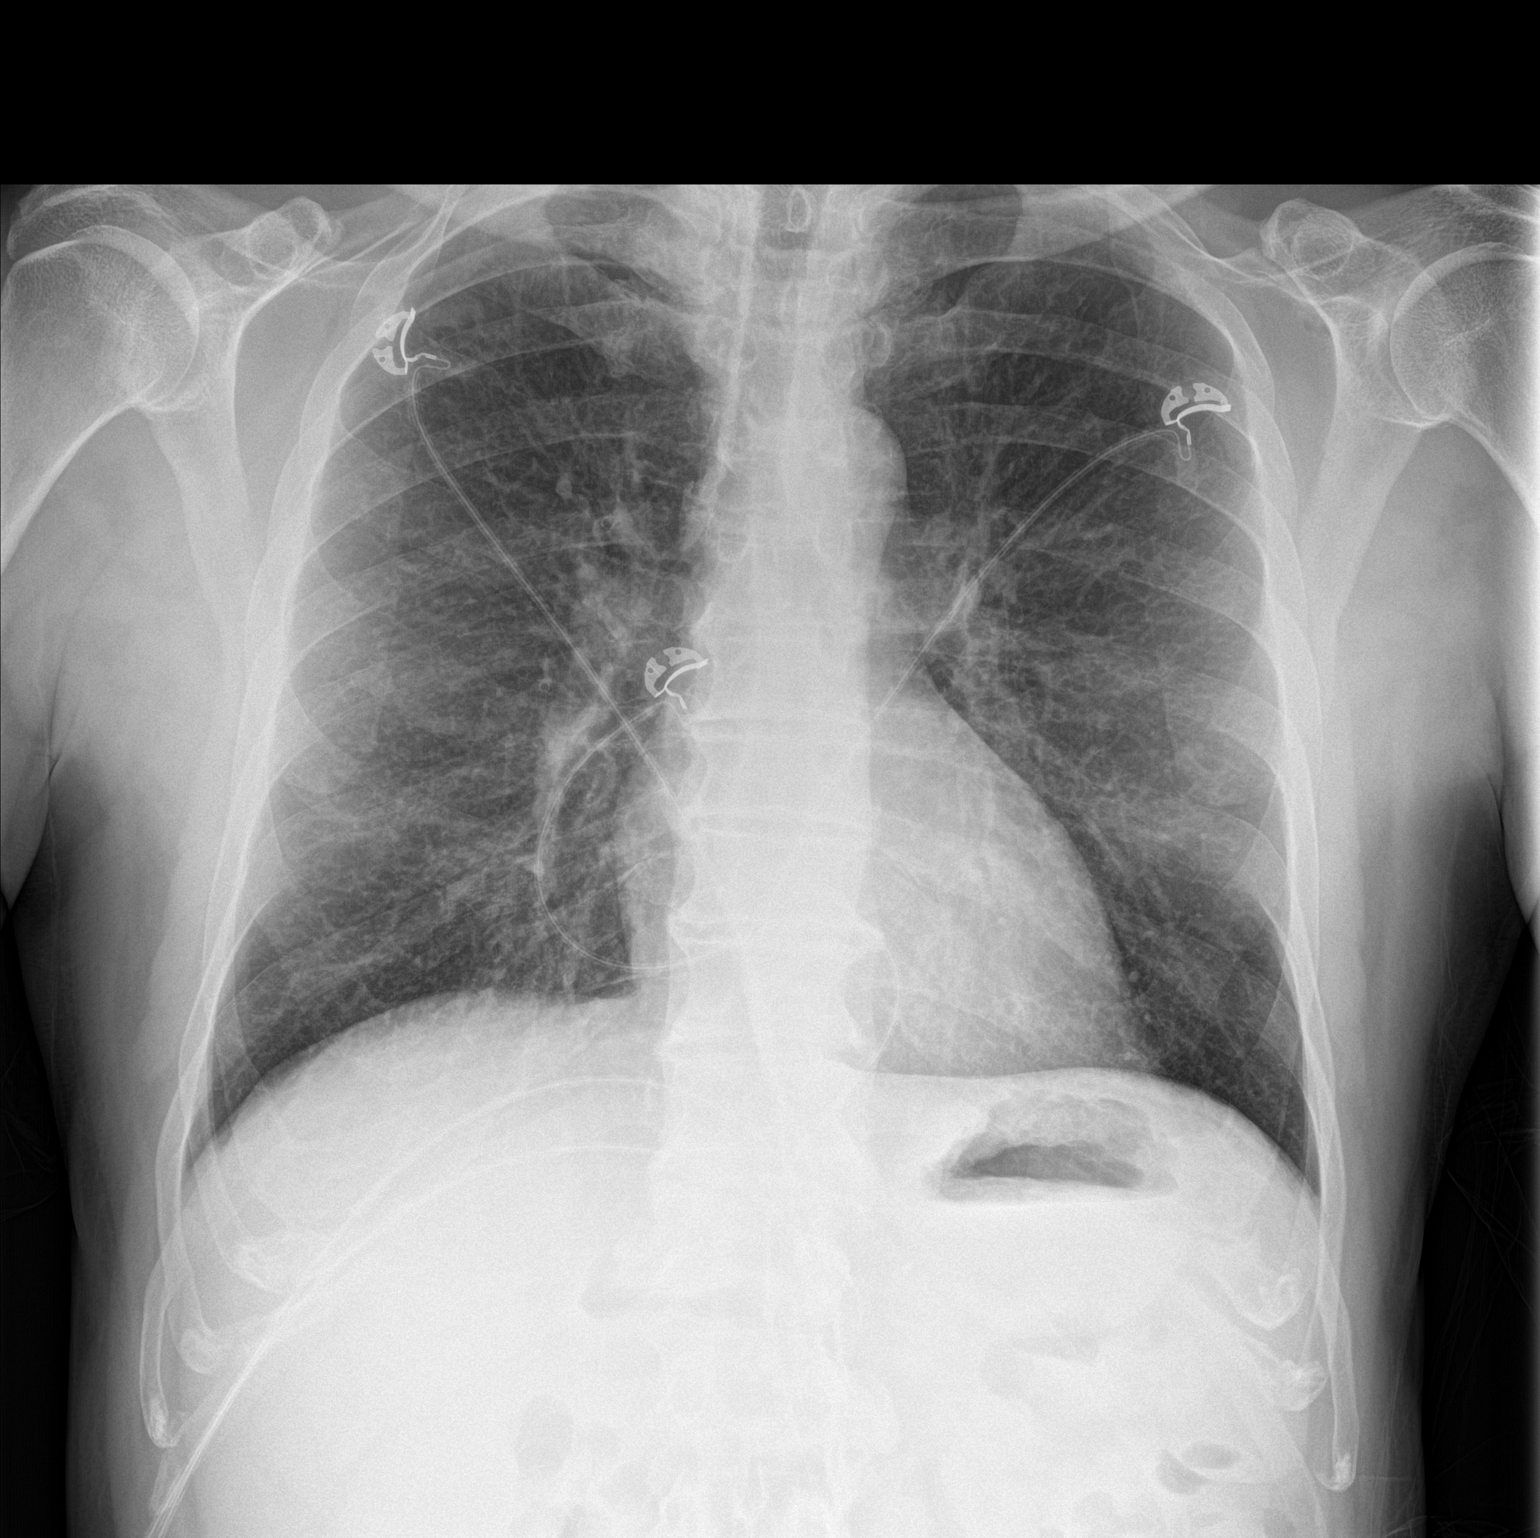
[im 2/2]
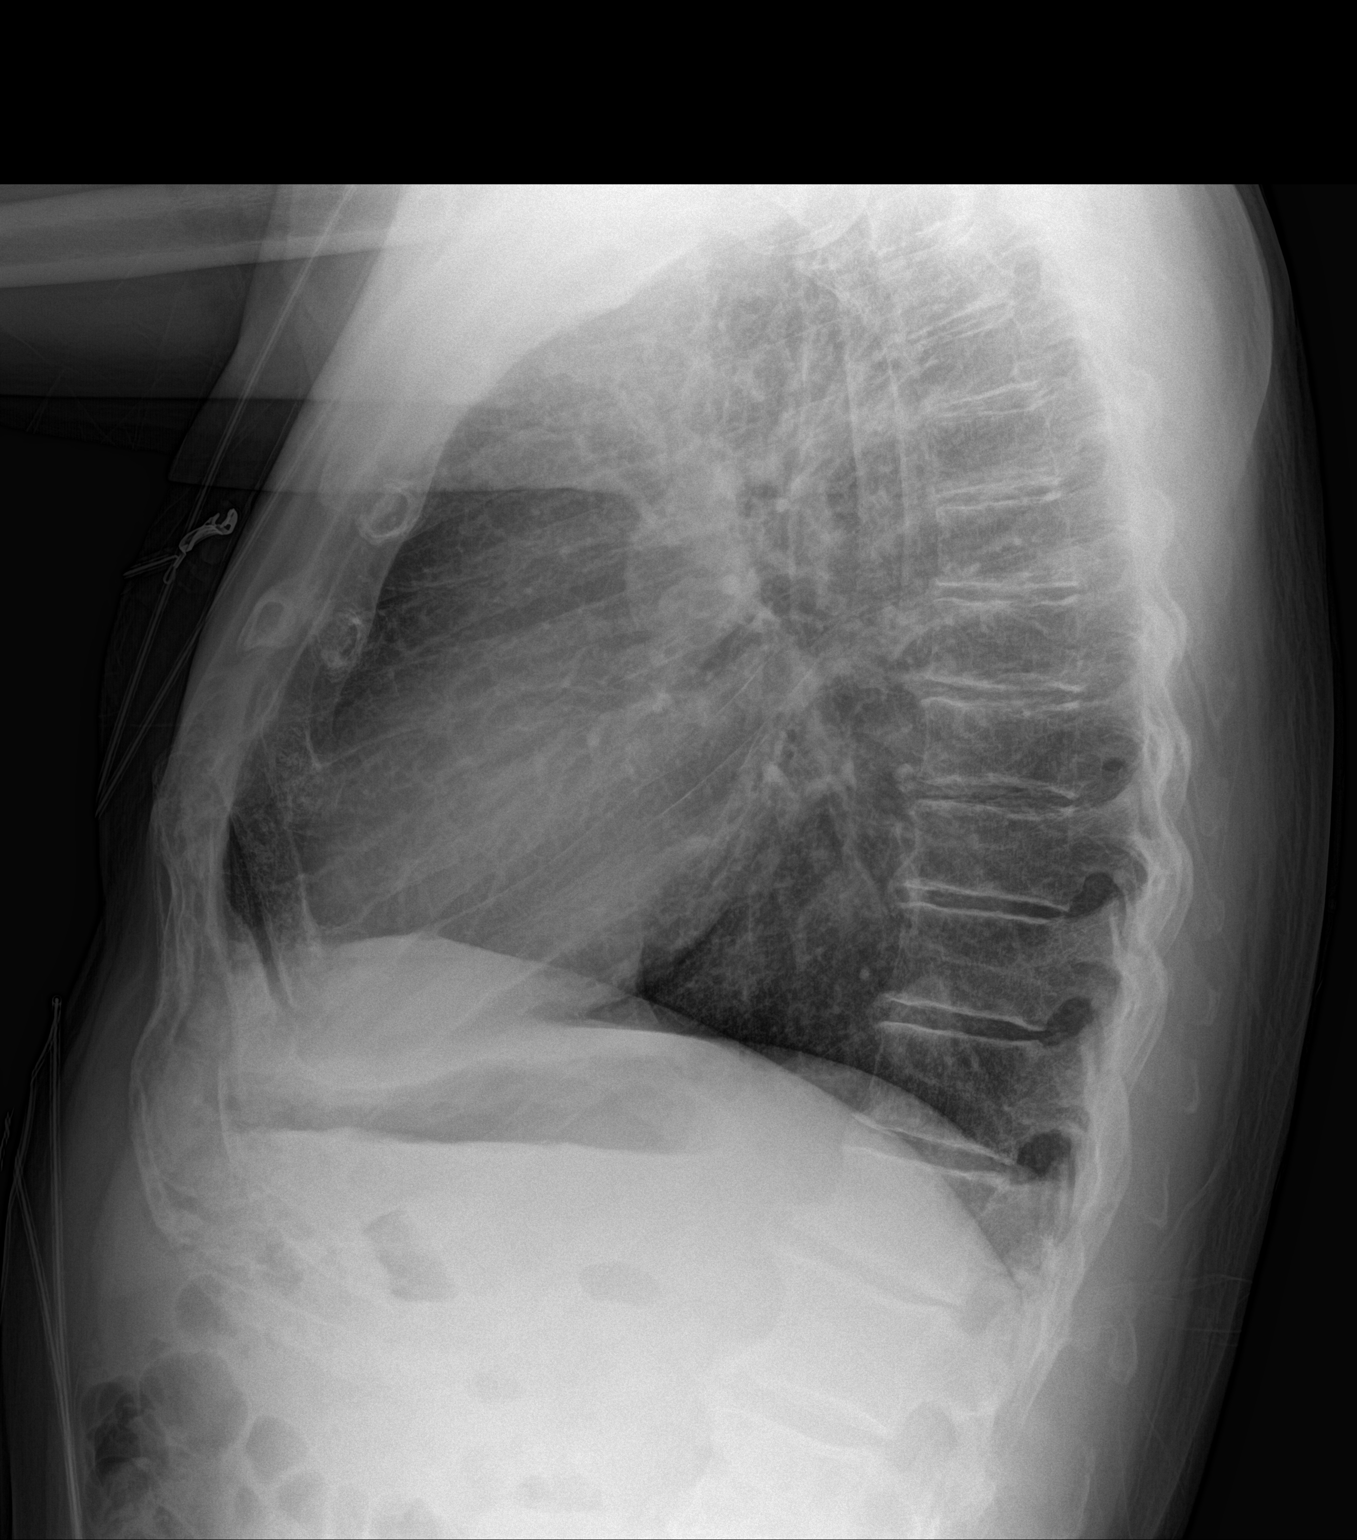

[2 of 2 positions shown; findings below may reference images not displayed]

FINDINGS: The lungs are clear. Heart size is normal. No pneumothorax or
pleural effusion. Aortic atherosclerosis is identified. Thoracic
spondylosis is noted.
IMPRESSION: No acute disease.

Atherosclerosis.
